# Patient Record
Sex: Female | Born: 1970 | Race: White | Hispanic: No | Marital: Single | Smoking: Never smoker
Health system: Southern US, Community
[De-identification: ages and names within clinical notes are randomized; demographics above are authoritative.]

## PROBLEM LIST (undated history)

## (undated) DIAGNOSIS — M722 Plantar fascial fibromatosis: Secondary | ICD-10-CM

## (undated) DIAGNOSIS — E119 Type 2 diabetes mellitus without complications: Secondary | ICD-10-CM

## (undated) HISTORY — DX: Type 2 diabetes mellitus without complications: E11.9

## (undated) HISTORY — DX: Plantar fascial fibromatosis: M72.2

---

## 1989-02-20 HISTORY — PX: TONSILLECTOMY AND ADENOIDECTOMY: SUR1326

## 1999-06-09 ENCOUNTER — Other Ambulatory Visit: Admission: RE | Admit: 1999-06-09 | Discharge: 1999-06-09 | Payer: Self-pay | Admitting: Obstetrics and Gynecology

## 2000-06-19 ENCOUNTER — Other Ambulatory Visit: Admission: RE | Admit: 2000-06-19 | Discharge: 2000-06-19 | Payer: Self-pay | Admitting: Obstetrics and Gynecology

## 2001-06-19 ENCOUNTER — Other Ambulatory Visit: Admission: RE | Admit: 2001-06-19 | Discharge: 2001-06-19 | Payer: Self-pay | Admitting: Obstetrics and Gynecology

## 2002-06-25 ENCOUNTER — Other Ambulatory Visit: Admission: RE | Admit: 2002-06-25 | Discharge: 2002-06-25 | Payer: Self-pay | Admitting: Obstetrics and Gynecology

## 2003-06-29 ENCOUNTER — Other Ambulatory Visit: Admission: RE | Admit: 2003-06-29 | Discharge: 2003-06-29 | Payer: Self-pay | Admitting: Obstetrics and Gynecology

## 2003-07-09 ENCOUNTER — Encounter: Admission: RE | Admit: 2003-07-09 | Discharge: 2003-07-09 | Payer: Self-pay | Admitting: Obstetrics and Gynecology

## 2004-09-13 ENCOUNTER — Other Ambulatory Visit: Admission: RE | Admit: 2004-09-13 | Discharge: 2004-09-13 | Payer: Self-pay | Admitting: Obstetrics and Gynecology

## 2005-10-25 ENCOUNTER — Other Ambulatory Visit: Admission: RE | Admit: 2005-10-25 | Discharge: 2005-10-25 | Payer: Self-pay | Admitting: Obstetrics and Gynecology

## 2007-01-30 ENCOUNTER — Other Ambulatory Visit: Admission: RE | Admit: 2007-01-30 | Discharge: 2007-01-30 | Payer: Self-pay | Admitting: Obstetrics and Gynecology

## 2008-01-31 ENCOUNTER — Other Ambulatory Visit: Admission: RE | Admit: 2008-01-31 | Discharge: 2008-01-31 | Payer: Self-pay | Admitting: Obstetrics and Gynecology

## 2009-03-03 ENCOUNTER — Encounter: Admission: RE | Admit: 2009-03-03 | Discharge: 2009-03-03 | Payer: Self-pay | Admitting: Obstetrics and Gynecology

## 2010-02-20 DIAGNOSIS — E119 Type 2 diabetes mellitus without complications: Secondary | ICD-10-CM

## 2010-02-20 HISTORY — DX: Type 2 diabetes mellitus without complications: E11.9

## 2010-03-09 ENCOUNTER — Encounter
Admission: RE | Admit: 2010-03-09 | Discharge: 2010-03-09 | Payer: Self-pay | Source: Home / Self Care | Attending: Obstetrics and Gynecology | Admitting: Obstetrics and Gynecology

## 2011-02-10 ENCOUNTER — Other Ambulatory Visit: Payer: Self-pay | Admitting: Obstetrics and Gynecology

## 2011-02-10 DIAGNOSIS — Z1231 Encounter for screening mammogram for malignant neoplasm of breast: Secondary | ICD-10-CM

## 2011-03-15 ENCOUNTER — Ambulatory Visit
Admission: RE | Admit: 2011-03-15 | Discharge: 2011-03-15 | Disposition: A | Discharge status: Home / Self Care | Payer: 59 | Source: Ambulatory Visit | Attending: Obstetrics and Gynecology | Admitting: Obstetrics and Gynecology

## 2011-03-15 DIAGNOSIS — Z1231 Encounter for screening mammogram for malignant neoplasm of breast: Secondary | ICD-10-CM

## 2012-02-06 ENCOUNTER — Other Ambulatory Visit: Payer: Self-pay | Admitting: Obstetrics and Gynecology

## 2012-02-06 DIAGNOSIS — Z1231 Encounter for screening mammogram for malignant neoplasm of breast: Secondary | ICD-10-CM

## 2012-03-15 ENCOUNTER — Ambulatory Visit
Admission: RE | Admit: 2012-03-15 | Discharge: 2012-03-15 | Disposition: A | Discharge status: Home / Self Care | Payer: 59 | Source: Ambulatory Visit | Attending: Obstetrics and Gynecology | Admitting: Obstetrics and Gynecology

## 2012-03-15 DIAGNOSIS — Z1231 Encounter for screening mammogram for malignant neoplasm of breast: Secondary | ICD-10-CM

## 2013-02-18 ENCOUNTER — Other Ambulatory Visit: Payer: Self-pay

## 2013-02-18 DIAGNOSIS — Z1231 Encounter for screening mammogram for malignant neoplasm of breast: Secondary | ICD-10-CM

## 2013-03-19 ENCOUNTER — Ambulatory Visit
Admission: RE | Admit: 2013-03-19 | Discharge: 2013-03-19 | Disposition: A | Discharge status: Home / Self Care | Payer: Self-pay | Source: Ambulatory Visit

## 2013-03-19 DIAGNOSIS — Z1231 Encounter for screening mammogram for malignant neoplasm of breast: Secondary | ICD-10-CM

## 2013-03-20 ENCOUNTER — Other Ambulatory Visit: Payer: Self-pay | Admitting: Obstetrics and Gynecology

## 2013-03-20 ENCOUNTER — Other Ambulatory Visit: Payer: Self-pay | Admitting: Nurse Practitioner

## 2013-03-20 DIAGNOSIS — R928 Other abnormal and inconclusive findings on diagnostic imaging of breast: Secondary | ICD-10-CM

## 2013-03-28 ENCOUNTER — Encounter: Payer: Self-pay | Admitting: *Deleted

## 2013-03-28 DIAGNOSIS — E119 Type 2 diabetes mellitus without complications: Secondary | ICD-10-CM | POA: Insufficient documentation

## 2013-03-31 ENCOUNTER — Encounter: Payer: Self-pay | Admitting: Gynecology

## 2013-03-31 ENCOUNTER — Ambulatory Visit: Payer: Self-pay | Admitting: Obstetrics and Gynecology

## 2013-03-31 ENCOUNTER — Ambulatory Visit
Admission: RE | Admit: 2013-03-31 | Discharge: 2013-03-31 | Disposition: A | Discharge status: Home / Self Care | Payer: 59 | Source: Ambulatory Visit | Attending: Obstetrics and Gynecology | Admitting: Obstetrics and Gynecology

## 2013-03-31 ENCOUNTER — Ambulatory Visit (INDEPENDENT_AMBULATORY_CARE_PROVIDER_SITE_OTHER): Payer: 59 | Admitting: Gynecology

## 2013-03-31 VITALS — BP 117/70 | HR 70 | Resp 18 | Ht 64.75 in | Wt 141.0 lb

## 2013-03-31 DIAGNOSIS — R928 Other abnormal and inconclusive findings on diagnostic imaging of breast: Secondary | ICD-10-CM

## 2013-03-31 DIAGNOSIS — Z01419 Encounter for gynecological examination (general) (routine) without abnormal findings: Secondary | ICD-10-CM

## 2013-03-31 DIAGNOSIS — I10 Essential (primary) hypertension: Secondary | ICD-10-CM

## 2013-03-31 NOTE — Progress Notes (Signed)
43 y.o. Married Caucasian female   G0P0000 here for annual exam. Pt is not currently sexually active.  Diabetes well controlled.  On linsinopril for kidney protection.  Patient's last menstrual period was 03/15/2013.          Sexually active: no  The current method of family planning is none.    Exercising: yes  walk 6x/wk; strength training 1x/wk Last pap: 03/27/12 NEG HR HPV Alcohol: no Tobacco: no BSE: yes  Mammogam 03/19/12 Bi-Rads 0; had [place] right breast under nipple fibroglandular tissue-BiRads 1  Labs: Severiano Gilbert, MD    Health Maintenance  Topic Date Due  . Pap Smear  07/14/1986  . Influenza Vaccine  09/20/2012  . Tetanus/tdap  02/21/2020    Family History  Problem Relation Age of Onset  . Diabetes Mother   . Cervical cancer Mother   . Diabetes Father   . Stroke Father   . Hypertension Father   . Diabetes Maternal Grandmother   . Diabetes Paternal Grandmother   . COPD Paternal Grandmother     Patient Active Problem List   Diagnosis Date Noted  . Essential hypertension, benign 03/31/2013  . Diabetes     Past Medical History  Diagnosis Date  . Plantar fasciitis     Right Heel  . Diabetes     Past Surgical History  Procedure Laterality Date  . Tonsillectomy and adenoidectomy  1991    Allergies: Sudafed  Current Outpatient Prescriptions  Medication Sig Dispense Refill  . Cetirizine HCl (ZYRTEC PO) Take by mouth.      . Ibuprofen (MOTRIN PO) Take by mouth as needed.       Marland Kitchen KRILL OIL PO Take by mouth.      Marland Kitchen lisinopril (PRINIVIL,ZESTRIL) 2.5 MG tablet Take 2.5 mg by mouth daily.      . metFORMIN (GLUCOPHAGE) 500 MG tablet Take by mouth daily.       . Multiple Vitamins-Minerals (MULTIVITAMIN PO) Take by mouth.      . ONE TOUCH ULTRA TEST test strip       . ONETOUCH DELICA LANCETS 33G MISC        No current facility-administered medications for this visit.    ROS: Pertinent items are noted in HPI.  Exam:    BP 117/70  Pulse 70  Resp 18  Ht  5' 4.75" (1.645 m)  Wt 141 lb (63.957 kg)  BMI 23.64 kg/m2  LMP 03/15/2013 Weight change: @WEIGHTCHANGE @ Last 3 height recordings:  Ht Readings from Last 3 Encounters:  03/31/13 5' 4.75" (1.645 m)   General appearance: alert, cooperative and appears stated age Head: Normocephalic, without obvious abnormality, atraumatic Neck: no adenopathy, no carotid bruit, no JVD, supple, symmetrical, trachea midline and thyroid not enlarged, symmetric, no tenderness/mass/nodules Lungs: clear to auscultation bilaterally Breasts: normal appearance, no masses or tenderness, fibroglandular Heart: regular rate and rhythm, S1, S2 normal, no murmur, click, rub or gallop Abdomen: soft, non-tender; bowel sounds normal; no masses,  no organomegaly Extremities: extremities normal, atraumatic, no cyanosis or edema Skin: Skin color, texture, turgor normal. No rashes or lesions Lymph nodes: Cervical, supraclavicular, and axillary nodes normal. no inguinal nodes palpated Neurologic: Grossly normal   Pelvic: External genitalia:  no lesions              Urethra: normal appearing urethra with no masses, tenderness or lesions              Bartholins and Skenes: normal  Vagina: normal appearing vagina with normal color and discharge, no lesions              Cervix: normal appearance              Pap taken: no        Bimanual Exam:  Uterus:  uterus is normal size, shape, consistency and nontender                                      Adnexa:    no masses                                      Rectovaginal: Confirms                                      Anus:  normal sphincter tone, no lesions  A: well woman diabetic     P: mammogram BiRADS 1 pap smear not done counseled on breast self exam, mammography screening, adequate intake of calcium and vitamin D, diet and exercise return annually or prn   An After Visit Summary was printed and given to the patient.

## 2013-03-31 NOTE — Patient Instructions (Signed)

## 2013-08-14 ENCOUNTER — Encounter: Payer: Self-pay | Admitting: Gynecology

## 2014-03-25 ENCOUNTER — Other Ambulatory Visit: Payer: Self-pay

## 2014-03-25 DIAGNOSIS — Z1231 Encounter for screening mammogram for malignant neoplasm of breast: Secondary | ICD-10-CM

## 2014-03-31 ENCOUNTER — Ambulatory Visit
Admission: RE | Admit: 2014-03-31 | Discharge: 2014-03-31 | Disposition: A | Discharge status: Home / Self Care | Payer: 59 | Source: Ambulatory Visit

## 2014-03-31 DIAGNOSIS — Z1231 Encounter for screening mammogram for malignant neoplasm of breast: Secondary | ICD-10-CM

## 2014-04-01 ENCOUNTER — Ambulatory Visit: Payer: 59 | Admitting: Gynecology

## 2014-04-01 ENCOUNTER — Ambulatory Visit (INDEPENDENT_AMBULATORY_CARE_PROVIDER_SITE_OTHER): Payer: 59 | Admitting: Nurse Practitioner

## 2014-04-01 ENCOUNTER — Encounter: Payer: Self-pay | Admitting: Nurse Practitioner

## 2014-04-01 VITALS — BP 94/62 | HR 72 | Resp 16 | Ht 64.6 in | Wt 143.0 lb

## 2014-04-01 DIAGNOSIS — Z01419 Encounter for gynecological examination (general) (routine) without abnormal findings: Secondary | ICD-10-CM

## 2014-04-01 NOTE — Patient Instructions (Signed)

## 2014-04-01 NOTE — Progress Notes (Signed)
44 y.o. G0 Single  Caucasian Fe here for annual exam.  Off OCP since late Jul 12, 2003.  Menses last 3-5 days.  Partner died 07-11-2012. Not dating or SA since then.  DM is under control.  HGB AIC 5.6 in November.  Patient's last menstrual period was 03/30/2014.          Sexually active: No.  The current method of family planning is abstinence.    Exercising: Yes.    Walking, Strenght training  Smoker:  no  Health Maintenance: Pap: 03/2012 Neg. HR HPV:neg MMG: 03/31/14 BIRADS1:neg TDaP:  Jul 12, 2010 Labs: PCP   reports that she has never smoked. She has never used smokeless tobacco. She reports that she does not drink alcohol or use illicit drugs.  Past Medical History  Diagnosis Date  . Plantar fasciitis     Right Heel  . Diabetes 02/2010    treated with oral med's    Past Surgical History  Procedure Laterality Date  . Tonsillectomy and adenoidectomy  Jul 11, 1989    Current Outpatient Prescriptions  Medication Sig Dispense Refill  . Cetirizine HCl (ZYRTEC PO) Take by mouth.    . Ibuprofen (MOTRIN PO) Take by mouth as needed.     Marland Kitchen KRILL OIL PO Take by mouth.    Marland Kitchen lisinopril (PRINIVIL,ZESTRIL) 2.5 MG tablet Take 2.5 mg by mouth daily.    . metFORMIN (GLUCOPHAGE-XR) 500 MG 24 hr tablet     . Multiple Vitamins-Minerals (MULTIVITAMIN PO) Take by mouth.    . ONE TOUCH ULTRA TEST test strip     . ONETOUCH DELICA LANCETS 33G MISC      No current facility-administered medications for this visit.    Family History  Problem Relation Age of Onset  . Diabetes Mother   . Cervical cancer Mother   . Diabetes Father   . Stroke Father   . Hypertension Father   . Diabetes Maternal Grandmother   . Diabetes Paternal Grandmother   . COPD Paternal Grandmother   . Kidney cancer Mother     hx. cervical cancer    ROS:  Pertinent items are noted in HPI.  Otherwise, a comprehensive ROS was negative.  Exam:   BP 94/62 mmHg  Pulse 72  Resp 16  Ht 5' 4.6" (1.641 m)  Wt 143 lb (64.864 kg)  BMI 24.09 kg/m2  LMP  03/30/2014 Height: 5' 4.6" (164.1 cm) Ht Readings from Last 3 Encounters:  04/01/14 5' 4.6" (1.641 m)  03/31/13 5' 4.75" (1.645 m)    General appearance: alert, cooperative and appears stated age Head: Normocephalic, without obvious abnormality, atraumatic Neck: no adenopathy, supple, symmetrical, trachea midline and thyroid normal to inspection and palpation Lungs: clear to auscultation bilaterally Breasts: normal appearance, no masses or tenderness Heart: regular rate and rhythm Abdomen: soft, non-tender; no masses,  no organomegaly Extremities: extremities normal, atraumatic, no cyanosis or edema Skin: Skin color, texture, turgor normal. No rashes or lesions Lymph nodes: Cervical, supraclavicular, and axillary nodes normal. No abnormal inguinal nodes palpated Neurologic: Grossly normal    Pelvic: External genitalia:  no lesions              Urethra:  normal appearing urethra with no masses, tenderness or lesions              Bartholin's and Skene's: normal                 Vagina: normal appearing vagina with normal color and moderate vaginal bleeding, no lesions  Cervix: anteverted              Pap taken: No. Bimanual Exam:  Uterus:  normal size, contour, position, consistency, mobility, non-tender              Adnexa: no mass, fullness, tenderness               Rectovaginal: Confirms               Anus:  normal sphincter tone, no lesions  Chaperone present: No  A:  Well Woman with normal exam  Normal menses  History of DM II since 02/2010 on oral med's and well controlled   P:   Reviewed health and wellness pertinent to exam  Pap smear not taken today  Mammogram is due 03/2015  Counseled on breast self exam, mammography screening, adequate intake of calcium and vitamin D, diet and exercise return annually or prn  An After Visit Summary was printed and given to the patient.

## 2014-04-03 NOTE — Progress Notes (Signed)
Reviewed personally.  M. Suzanne Taleyah Hillman, MD.  

## 2015-03-09 ENCOUNTER — Other Ambulatory Visit: Payer: Self-pay

## 2015-03-09 DIAGNOSIS — Z1231 Encounter for screening mammogram for malignant neoplasm of breast: Secondary | ICD-10-CM

## 2015-04-07 ENCOUNTER — Ambulatory Visit
Admission: RE | Admit: 2015-04-07 | Discharge: 2015-04-07 | Disposition: A | Discharge status: Home / Self Care | Payer: Commercial Managed Care - HMO | Source: Ambulatory Visit

## 2015-04-07 DIAGNOSIS — Z1231 Encounter for screening mammogram for malignant neoplasm of breast: Secondary | ICD-10-CM

## 2015-04-28 ENCOUNTER — Encounter: Payer: Self-pay | Admitting: Nurse Practitioner

## 2015-04-28 ENCOUNTER — Ambulatory Visit (INDEPENDENT_AMBULATORY_CARE_PROVIDER_SITE_OTHER): Payer: Commercial Managed Care - HMO | Admitting: Nurse Practitioner

## 2015-04-28 VITALS — BP 108/62 | HR 80 | Resp 14 | Ht 64.5 in | Wt 146.0 lb

## 2015-04-28 DIAGNOSIS — Z0000 Encounter for general adult medical examination without abnormal findings: Secondary | ICD-10-CM | POA: Diagnosis not present

## 2015-04-28 DIAGNOSIS — Z01419 Encounter for gynecological examination (general) (routine) without abnormal findings: Secondary | ICD-10-CM | POA: Diagnosis not present

## 2015-04-28 NOTE — Patient Instructions (Addendum)

## 2015-04-28 NOTE — Progress Notes (Signed)
Patient ID: Marissa Foster, female   DOB: 1970-08-15, 45 y.o.   MRN: [id number] 45 y.o. G0P0 Single  Caucasian Fe here for annual exam.  Menses now at 5 days.  Irregular in January and February was normal. Still some insomnia and bloating.  Not dating or SA since 2014.  Last HGB AIC 6.0 last week.  Patient's last menstrual period was 04/20/2015.          Sexually active: No.  The current method of family planning is none.    Exercising: Yes.    walking Smoker:  no  Health Maintenance: Pap:  03/2012 WNL NEG HR HPV MMG:  04-14-15 WNL BIRADS 1 TDaP:  02-20-2010 Pneumonia: 2012 PCP HIV: 01/31/2008 Labs: PCP does labs    reports that she has never smoked. She has never used smokeless tobacco. She reports that she does not drink alcohol or use illicit drugs.  Past Medical History  Diagnosis Date  . Plantar fasciitis     Right Heel  . Diabetes (HCC) 02/2010    treated with oral med's    Past Surgical History  Procedure Laterality Date  . Tonsillectomy and adenoidectomy  1991    Current Outpatient Prescriptions  Medication Sig Dispense Refill  . Cetirizine HCl (ZYRTEC PO) Take by mouth.    . Ibuprofen (MOTRIN PO) Take by mouth as needed.     Marland Kitchen lisinopril (PRINIVIL,ZESTRIL) 2.5 MG tablet Take 2.5 mg by mouth daily.    . metFORMIN (GLUCOPHAGE-XR) 500 MG 24 hr tablet     . Multiple Vitamins-Minerals (MULTIVITAMIN PO) Take by mouth.    . ONE TOUCH ULTRA TEST test strip     . ONETOUCH DELICA LANCETS 33G MISC     . KRILL OIL PO Take by mouth. Reported on 04/28/2015     No current facility-administered medications for this visit.    Family History  Problem Relation Age of Onset  . Diabetes Mother   . Cervical cancer Mother   . Kidney cancer Mother     hx. cervical cancer  . Diabetes Father     right leg below knee amputation  . Stroke Father   . Hypertension Father   . Diabetes Maternal Grandmother   . Diabetes Paternal Grandmother   . COPD Paternal Grandmother   . Hyperlipidemia  Brother   . Hypertension Brother     ROS:  Pertinent items are noted in HPI.  Otherwise, a comprehensive ROS was negative.  Exam:   BP 108/62 mmHg  Pulse 80  Resp 14  Ht 5' 4.5" (1.638 m)  Wt 146 lb (66.225 kg)  BMI 24.68 kg/m2  LMP 04/20/2015 Height: 5' 4.5" (163.8 cm) Ht Readings from Last 3 Encounters:  04/28/15 5' 4.5" (1.638 m)  04/01/14 5' 4.6" (1.641 m)  03/31/13 5' 4.75" (1.645 m)    General appearance: alert, cooperative and appears stated age Head: Normocephalic, without obvious abnormality, atraumatic Neck: no adenopathy, supple, symmetrical, trachea midline and thyroid normal to inspection and palpation Lungs: clear to auscultation bilaterally Breasts: normal appearance, no masses or tenderness Heart: regular rate and rhythm Abdomen: soft, non-tender; no masses,  no organomegaly Extremities: extremities normal, atraumatic, no cyanosis or edema Skin: Skin color, texture, turgor normal. No rashes or lesions Lymph nodes: Cervical, supraclavicular, and axillary nodes normal. No abnormal inguinal nodes palpated Neurologic: Grossly normal   Pelvic: External genitalia:  no lesions              Urethra:  normal appearing urethra with no  masses, tenderness or lesions              Bartholin's and Skene's: normal                 Vagina: normal appearing vagina with normal color and discharge, no lesions              Cervix: anteverted              Pap taken: Yes.   Bimanual Exam:  Uterus:  normal size, contour, position, consistency, mobility, non-tender              Adnexa: no mass, fullness, tenderness               Rectovaginal: Confirms               Anus:  normal sphincter tone, no lesions  Chaperone present: yes  A:  Well Woman with normal exam  Normal menses History of DM II since 02/2010 on oral med's and well controlled  Not SA   P:   Reviewed health and wellness pertinent to exam  Pap smear as above  Mammogram is due 03/2016  Counseled on  breast self exam, mammography screening, adequate intake of calcium and vitamin D, diet and exercise return annually or prn  An After Visit Summary was printed and given to the patient.

## 2015-04-29 ENCOUNTER — Encounter: Payer: Self-pay | Admitting: Nurse Practitioner

## 2015-05-02 LAB — IPS PAP TEST WITH HPV

## 2015-05-02 NOTE — Progress Notes (Signed)
Encounter reviewed by Dr. Brook Amundson C. Silva.  

## 2015-10-26 DIAGNOSIS — Z0000 Encounter for general adult medical examination without abnormal findings: Secondary | ICD-10-CM | POA: Diagnosis not present

## 2015-10-26 DIAGNOSIS — Z13220 Encounter for screening for lipoid disorders: Secondary | ICD-10-CM | POA: Diagnosis not present

## 2015-10-26 DIAGNOSIS — Z23 Encounter for immunization: Secondary | ICD-10-CM | POA: Diagnosis not present

## 2015-10-26 DIAGNOSIS — E119 Type 2 diabetes mellitus without complications: Secondary | ICD-10-CM | POA: Diagnosis not present

## 2015-11-23 DIAGNOSIS — H524 Presbyopia: Secondary | ICD-10-CM | POA: Diagnosis not present

## 2015-11-23 DIAGNOSIS — E119 Type 2 diabetes mellitus without complications: Secondary | ICD-10-CM | POA: Diagnosis not present

## 2016-03-01 ENCOUNTER — Other Ambulatory Visit: Payer: Self-pay | Admitting: Family Medicine

## 2016-03-01 DIAGNOSIS — Z1231 Encounter for screening mammogram for malignant neoplasm of breast: Secondary | ICD-10-CM

## 2016-04-07 ENCOUNTER — Ambulatory Visit
Admission: RE | Admit: 2016-04-07 | Discharge: 2016-04-07 | Disposition: A | Discharge status: Home / Self Care | Payer: BLUE CROSS/BLUE SHIELD | Source: Ambulatory Visit | Attending: Family Medicine | Admitting: Family Medicine

## 2016-04-07 DIAGNOSIS — Z1231 Encounter for screening mammogram for malignant neoplasm of breast: Secondary | ICD-10-CM | POA: Diagnosis not present

## 2016-04-24 DIAGNOSIS — E119 Type 2 diabetes mellitus without complications: Secondary | ICD-10-CM | POA: Diagnosis not present

## 2016-05-09 NOTE — Progress Notes (Signed)
46 y.o. G0P0 Single  Caucasian Fe here for annual exam.  Menses now at 3-5 days.  No vaso symptoms.  Some cramps and mild HA.  No new health problems.   Patient's last menstrual period was 04/30/2016.          Sexually active: No.  The current method of family planning is abstinence.    Exercising: Yes.    walking, strength training Smoker:  no  Health Maintenance: Pap:  04/28/15 Pap smear and HR HPV negative           03/2012 WNL NEG HR HPV MMG:  04/07/16 BIRADS 1 negative/density c TDaP:  02/20/2010 Pneumonia: 2012 PCP HIV: 01/31/2008 Labs: PCP does labs   reports that she has never smoked. She has never used smokeless tobacco. She reports that she does not drink alcohol or use drugs.  Past Medical History:  Diagnosis Date  . Diabetes (HCC) 02/2010   treated with oral med's  . Plantar fasciitis    Right Heel    Past Surgical History:  Procedure Laterality Date  . TONSILLECTOMY AND ADENOIDECTOMY  1991    Current Outpatient Prescriptions  Medication Sig Dispense Refill  . Cetirizine HCl (ZYRTEC PO) Take by mouth.    Marland Kitchen glucose blood (BAYER CONTOUR TEST) test strip 1 each by Other route daily. Use as instructed    . Ibuprofen (MOTRIN PO) Take by mouth as needed.     . Lancets MISC by Does not apply route. Contour lancets; check blood sugar every morning    . lisinopril (PRINIVIL,ZESTRIL) 2.5 MG tablet Take 2.5 mg by mouth daily.    . metFORMIN (GLUCOPHAGE-XR) 500 MG 24 hr tablet     . Multiple Vitamins-Minerals (MULTIVITAMIN PO) Take by mouth.     No current facility-administered medications for this visit.     Family History  Problem Relation Age of Onset  . Diabetes Mother   . Cervical cancer Mother   . Kidney cancer Mother     hx. cervical cancer  . Diabetes Father     right leg below knee amputation  . Stroke Father   . Hypertension Father   . Hyperlipidemia Brother   . Hypertension Brother   . Diabetes Maternal Grandmother   . Diabetes Paternal Grandmother   .  COPD Paternal Grandmother     ROS:  Pertinent items are noted in HPI.  Otherwise, a comprehensive ROS was negative.  Exam:   BP 110/62 (BP Location: Right Arm, Patient Position: Sitting, Cuff Size: Normal)   Pulse 76   Resp 16   Ht 5' 4.5" (1.638 m)   Wt 147 lb (66.7 kg)   LMP 04/30/2016   BMI 24.84 kg/m  Height: 5' 4.5" (163.8 cm) Ht Readings from Last 3 Encounters:  05/10/16 5' 4.5" (1.638 m)  04/28/15 5' 4.5" (1.638 m)  04/01/14 5' 4.6" (1.641 m)    General appearance: alert, cooperative and appears stated age Head: Normocephalic, without obvious abnormality, atraumatic Neck: no adenopathy, supple, symmetrical, trachea midline and thyroid normal to inspection and palpation Lungs: clear to auscultation bilaterally Breasts: normal appearance, no masses or tenderness Heart: regular rate and rhythm Abdomen: soft, non-tender; no masses,  no organomegaly Extremities: extremities normal, atraumatic, no cyanosis or edema Skin: Skin color, texture, turgor normal. No rashes or lesions Lymph nodes: Cervical, supraclavicular, and axillary nodes normal. No abnormal inguinal nodes palpated Neurologic: Grossly normal   Pelvic: External genitalia:  no lesions  Urethra:  normal appearing urethra with no masses, tenderness or lesions              Bartholin's and Skene's: normal                 Vagina: normal appearing vagina with normal color and discharge, no lesions              Cervix: anteverted              Pap taken: No. Bimanual Exam:  Uterus:  normal size, contour, position, consistency, mobility, non-tender              Adnexa: no mass, fullness, tenderness               Rectovaginal: Confirms               Anus:  normal sphincter tone, no lesions  Chaperone present: no  A:  Well Woman with normal exam  Normal menses History of DM II since 02/2010 on oral med's and well controlled             Not SA   P:   Reviewed health and wellness pertinent to  exam  Pap smear not done  Mammogram is due 03/2017 counseled on breast self exam, mammography screening, adequate intake of calcium and vitamin D, diet and exercise return annually or prn  An After Visit Summary was printed and given to the patient.

## 2016-05-10 ENCOUNTER — Ambulatory Visit (INDEPENDENT_AMBULATORY_CARE_PROVIDER_SITE_OTHER): Payer: BLUE CROSS/BLUE SHIELD | Admitting: Nurse Practitioner

## 2016-05-10 ENCOUNTER — Encounter: Payer: Self-pay | Admitting: Nurse Practitioner

## 2016-05-10 VITALS — BP 110/62 | HR 76 | Resp 16 | Ht 64.5 in | Wt 147.0 lb

## 2016-05-10 DIAGNOSIS — Z0000 Encounter for general adult medical examination without abnormal findings: Secondary | ICD-10-CM | POA: Diagnosis not present

## 2016-05-10 DIAGNOSIS — Z01411 Encounter for gynecological examination (general) (routine) with abnormal findings: Secondary | ICD-10-CM | POA: Diagnosis not present

## 2016-05-10 DIAGNOSIS — E559 Vitamin D deficiency, unspecified: Secondary | ICD-10-CM

## 2016-05-10 LAB — VITAMIN D 25 HYDROXY (VIT D DEFICIENCY, FRACTURES): VIT D 25 HYDROXY: 37 ng/mL (ref 30–100)

## 2016-05-10 NOTE — Patient Instructions (Signed)

## 2016-05-11 NOTE — Progress Notes (Signed)
Encounter reviewed by Dr. Brook Amundson C. Silva.  

## 2016-06-13 DIAGNOSIS — M25552 Pain in left hip: Secondary | ICD-10-CM | POA: Diagnosis not present

## 2016-06-14 ENCOUNTER — Ambulatory Visit
Admission: RE | Admit: 2016-06-14 | Discharge: 2016-06-14 | Disposition: A | Discharge status: Home / Self Care | Payer: BLUE CROSS/BLUE SHIELD | Source: Ambulatory Visit | Attending: Family Medicine | Admitting: Family Medicine

## 2016-06-14 ENCOUNTER — Other Ambulatory Visit: Payer: Self-pay | Admitting: Family Medicine

## 2016-06-14 DIAGNOSIS — M25552 Pain in left hip: Secondary | ICD-10-CM | POA: Diagnosis not present

## 2016-09-26 ENCOUNTER — Telehealth: Payer: Self-pay | Admitting: Obstetrics & Gynecology

## 2016-09-26 NOTE — Telephone Encounter (Signed)
Left patient a message to call back to reschedule a future appointment that was cancelled by the provider. °

## 2016-10-11 DIAGNOSIS — L0390 Cellulitis, unspecified: Secondary | ICD-10-CM | POA: Diagnosis not present

## 2016-10-11 DIAGNOSIS — L600 Ingrowing nail: Secondary | ICD-10-CM | POA: Diagnosis not present

## 2016-10-17 ENCOUNTER — Encounter: Payer: Self-pay | Admitting: Podiatry

## 2016-10-17 ENCOUNTER — Ambulatory Visit (INDEPENDENT_AMBULATORY_CARE_PROVIDER_SITE_OTHER): Payer: BLUE CROSS/BLUE SHIELD | Admitting: Podiatry

## 2016-10-17 ENCOUNTER — Telehealth: Payer: Self-pay | Admitting: *Deleted

## 2016-10-17 VITALS — BP 122/77 | HR 78

## 2016-10-17 DIAGNOSIS — L600 Ingrowing nail: Secondary | ICD-10-CM

## 2016-10-17 DIAGNOSIS — E119 Type 2 diabetes mellitus without complications: Secondary | ICD-10-CM

## 2016-10-17 DIAGNOSIS — L03031 Cellulitis of right toe: Secondary | ICD-10-CM

## 2016-10-17 MED ORDER — CEPHALEXIN 500 MG PO CAPS: 500.0000 mg | ORAL_CAPSULE | Freq: Two times a day (BID) | ORAL | 0 refills | Status: DC

## 2016-10-17 NOTE — Telephone Encounter (Signed)
Received escribed message that rx was not received properly at the [facility]. I called the Cephalexin 500mg  #20 one bid to the [id number].

## 2016-10-17 NOTE — Progress Notes (Signed)
   Subjective:    Patient ID: Marissa Foster, female    DOB: April 26, 1970, 46 y.o.   MRN: [id number]  HPI this patient presents the office with chief complaint of a painful ingrowing toenail on the big toe, right foot.  Patient states that she worked on her nails herself in early July and since that time. She has developed a red, swollen, infected ingrown toenail right big toe.  She says she has tried to heal herself and she has continued to be active by walking, but the problem has never resolved.  She says this area has become increasingly red, swollen and painful and presents the office today for definitive evaluation and treatment of this infected ingrown toenail.  This patient is diabetic on metformin.    Review of Systems  All other systems reviewed and are negative.      Objective:   Physical Exam GENERAL APPEARANCE: Alert, conversant. Appropriately groomed. No acute distress.  VASCULAR: Pedal pulses are  palpable at  [facility] and PT bilateral.  Capillary refill time is immediate to all digits,  Normal temperature gradient.  Digital hair growth is present bilateral  NEUROLOGIC: sensation is normal to 5.07 monofilament at 5/5 sites bilateral.  Light touch is intact bilateral, Muscle strength normal.  MUSCULOSKELETAL: acceptable muscle strength, tone and stability bilateral.  Intrinsic muscluature intact bilateral.  Rectus appearance of foot and digits noted bilateral.  NAILS  red, swollen, inflamed lateral border right great toenail.   DERMATOLOGIC: skin color, texture, and turgor are within normal limits.  No preulcerative lesions or ulcers  are seen, no interdigital maceration noted.  No open lesions present.  Digital nails are asymptomatic. No drainage noted.         Assessment & Plan:  Paronychia right great toenail   Ingrown toenail right great toe.  IE  Nail surgery.  Treatment options and alternatives discussed.  Recommended permanent phenol matrixectomy and patient agreed.  Right  hallux  was prepped with alcohol and a toe block of 3cc of 2% lidocaine plain was administered in a digital toe block. .  The toe was then prepped with betadine solution .  The offending nail border was then excised and matrix tissue exposed.  Phenol was then applied to the matrix tissue followed by an alcohol wash.  Antibiotic ointment and a dry sterile dressing was applied.  The patient was dispensed instructions for aftercare. Cephalexin 500 mg.  #20.  RTC 1 week.     Helane Gunther DPM

## 2016-10-25 ENCOUNTER — Ambulatory Visit: Payer: BLUE CROSS/BLUE SHIELD | Admitting: Podiatry

## 2016-11-02 DIAGNOSIS — E119 Type 2 diabetes mellitus without complications: Secondary | ICD-10-CM | POA: Diagnosis not present

## 2016-11-02 DIAGNOSIS — Z23 Encounter for immunization: Secondary | ICD-10-CM | POA: Diagnosis not present

## 2016-11-02 DIAGNOSIS — Z0000 Encounter for general adult medical examination without abnormal findings: Secondary | ICD-10-CM | POA: Diagnosis not present

## 2016-11-22 DIAGNOSIS — H5211 Myopia, right eye: Secondary | ICD-10-CM | POA: Diagnosis not present

## 2016-11-22 DIAGNOSIS — E119 Type 2 diabetes mellitus without complications: Secondary | ICD-10-CM | POA: Diagnosis not present

## 2017-03-21 ENCOUNTER — Other Ambulatory Visit: Payer: Self-pay | Admitting: Family Medicine

## 2017-03-21 DIAGNOSIS — Z1231 Encounter for screening mammogram for malignant neoplasm of breast: Secondary | ICD-10-CM

## 2017-04-10 ENCOUNTER — Ambulatory Visit
Admission: RE | Admit: 2017-04-10 | Discharge: 2017-04-10 | Disposition: A | Discharge status: Home / Self Care | Payer: BLUE CROSS/BLUE SHIELD | Source: Ambulatory Visit | Attending: Family Medicine | Admitting: Family Medicine

## 2017-04-10 DIAGNOSIS — Z1231 Encounter for screening mammogram for malignant neoplasm of breast: Secondary | ICD-10-CM

## 2017-05-02 DIAGNOSIS — E119 Type 2 diabetes mellitus without complications: Secondary | ICD-10-CM | POA: Diagnosis not present

## 2017-05-14 ENCOUNTER — Ambulatory Visit: Payer: BLUE CROSS/BLUE SHIELD | Admitting: Nurse Practitioner

## 2017-05-16 ENCOUNTER — Encounter: Payer: Self-pay | Admitting: Certified Nurse Midwife

## 2017-05-16 ENCOUNTER — Ambulatory Visit: Payer: BLUE CROSS/BLUE SHIELD | Admitting: Certified Nurse Midwife

## 2017-05-16 ENCOUNTER — Other Ambulatory Visit: Payer: Self-pay

## 2017-05-16 VITALS — BP 108/70 | HR 70 | Resp 16 | Ht 64.5 in | Wt 146.0 lb

## 2017-05-16 DIAGNOSIS — Z8639 Personal history of other endocrine, nutritional and metabolic disease: Secondary | ICD-10-CM

## 2017-05-16 DIAGNOSIS — N951 Menopausal and female climacteric states: Secondary | ICD-10-CM | POA: Diagnosis not present

## 2017-05-16 DIAGNOSIS — Z01419 Encounter for gynecological examination (general) (routine) without abnormal findings: Secondary | ICD-10-CM | POA: Diagnosis not present

## 2017-05-16 NOTE — Patient Instructions (Signed)

## 2017-05-16 NOTE — Progress Notes (Signed)
47 y.o. G0P0 Single  Caucasian Fe here for annual exam. Periods changing, now less heavy with 3 day duration from her long term of 5 days. Usually no every 25-26 days, keeping menses record. No missed periods. Denies any missed periods. Sees Dr. Merri Brunetteandace Smith yearly for exam and labs, glucose management and Lisinopril(no hypertension). All stable per patient, is seen every 6 months. Emotionally sad, Dad passed away in 9/18. Has family and friends support. No other health issues today.   Patient's last menstrual period was 05/07/2017 (exact date).          Sexually active: No.  The current method of family planning is abstinence.    Exercising: Yes.    walking & strength training Smoker:  no  Health Maintenance: Pap:  04-28-15 neg HPV HR neg History of Abnormal Pap: no MMG:  04-10-17 category b density birads 1:neg Self Breast exams: yes Colonoscopy:  none BMD:   none TDaP:  2012 Shingles: no Pneumonia: 2012 Hep C and HIV: HIV neg per patient Labs: with PCP   reports that she has never smoked. She has never used smokeless tobacco. She reports that she does not drink alcohol or use drugs.  Past Medical History:  Diagnosis Date  . Diabetes ([facility]) 02/2010   treated with oral med's  . Plantar fasciitis    Right Heel    Past Surgical History:  Procedure Laterality Date  . TONSILLECTOMY AND ADENOIDECTOMY  1991    Current Outpatient Medications  Medication Sig Dispense Refill  . Cetirizine HCl (ZYRTEC PO) Take by mouth.    Marland Kitchen. glucose blood (BAYER CONTOUR TEST) test strip 1 each by Other route daily. Use as instructed    . Lancets MISC by Does not apply route. Contour lancets; check blood sugar every morning    . lisinopril (PRINIVIL,ZESTRIL) 2.5 MG tablet Take 2.5 mg by mouth daily.    . metFORMIN (GLUCOPHAGE-XR) 500 MG 24 hr tablet 2 (two) times daily before a meal.     . Multiple Vitamins-Minerals (MULTIVITAMIN PO) Take by mouth.    . Ibuprofen (MOTRIN PO) Take by mouth as needed.       No current facility-administered medications for this visit.     Family History  Problem Relation Age of Onset  . Diabetes Mother   . Cervical cancer Mother   . Kidney cancer Mother        hx. cervical cancer  . Diabetes Father        right leg below knee amputation  . Stroke Father   . Hypertension Father   . Hyperlipidemia Brother   . Hypertension Brother   . Diabetes Maternal Grandmother   . Diabetes Paternal Grandmother   . COPD Paternal Grandmother     ROS:  Pertinent items are noted in HPI.  Otherwise, a comprehensive ROS was negative.  Exam:   BP 108/70   Pulse 70   Resp 16   Ht 5' 4.5" (1.638 m)   Wt 146 lb (66.2 kg)   LMP 05/07/2017 (Exact Date)   BMI 24.67 kg/m  Height: 5' 4.5" (163.8 cm) Ht Readings from Last 3 Encounters:  05/16/17 5' 4.5" (1.638 m)  05/10/16 5' 4.5" (1.638 m)  04/28/15 5' 4.5" (1.638 m)    General appearance: alert, cooperative and appears stated age Head: Normocephalic, without obvious abnormality, atraumatic Neck: no adenopathy, supple, symmetrical, trachea midline and thyroid normal to inspection and palpation Lungs: clear to auscultation bilaterally Breasts: normal appearance, no masses or tenderness, No  nipple retraction or dimpling, No nipple discharge or bleeding, No axillary or supraclavicular adenopathy Heart: regular rate and rhythm Abdomen: soft, non-tender; no masses,  no organomegaly Extremities: extremities normal, atraumatic, no cyanosis or edema Skin: Skin color, texture, turgor normal. No rashes or lesions Lymph nodes: Cervical, supraclavicular, and axillary nodes normal. No abnormal inguinal nodes palpated Neurologic: Grossly normal   Pelvic: External genitalia:  no lesions              Urethra:  normal appearing urethra with no masses, tenderness or lesions              Bartholin's and Skene's: normal                 Vagina: normal appearing vagina with normal color and discharge, no lesions               Cervix: no cervical motion tenderness, no lesions and nulliparous appearance              Pap taken: No. Bimanual Exam:  Uterus:  normal size, contour, position, consistency, mobility, non-tender              Adnexa: normal adnexa and no mass, fullness, tenderness               Rectovaginal: Confirms               Anus:  normal sphincter tone, no lesions  Chaperone present: yes  A:  Well Woman with normal exam  Not sexually active ever  Cycle change with shorter cycles, no missed periods ? perimenopausal  Diabetes on Metformin/Lisinpril with PCP management  Recent death of father  P:   Reviewed health and wellness pertinent to exam  Will advise if becomes sexually active  Discussed cycle changes with perimenopause or with weight change. Discussed importance of keeping record of menses and advise if not normal per printed information given or misses more than 2 periods. Questions addressed.  Continue with PCP as indicated  Discussed seeking comfort and support from friends and family and grief support if needed.  Pap smear: no   counseled on breast self exam, STD prevention, HIV risk factors and prevention, adequate intake of calcium and vitamin D, diet and exercise,colonoscopy screening  return annually or prn  An After Visit Summary was printed and given to the patient.

## 2017-06-21 DIAGNOSIS — F4322 Adjustment disorder with anxiety: Secondary | ICD-10-CM | POA: Diagnosis not present

## 2017-06-26 DIAGNOSIS — F4322 Adjustment disorder with anxiety: Secondary | ICD-10-CM | POA: Diagnosis not present

## 2017-07-11 DIAGNOSIS — L723 Sebaceous cyst: Secondary | ICD-10-CM | POA: Diagnosis not present

## 2017-07-31 DIAGNOSIS — F4322 Adjustment disorder with anxiety: Secondary | ICD-10-CM | POA: Diagnosis not present

## 2017-08-09 DIAGNOSIS — F4322 Adjustment disorder with anxiety: Secondary | ICD-10-CM | POA: Diagnosis not present

## 2017-08-30 DIAGNOSIS — F4322 Adjustment disorder with anxiety: Secondary | ICD-10-CM | POA: Diagnosis not present

## 2017-09-04 DIAGNOSIS — F4322 Adjustment disorder with anxiety: Secondary | ICD-10-CM | POA: Diagnosis not present

## 2017-09-20 DIAGNOSIS — F4322 Adjustment disorder with anxiety: Secondary | ICD-10-CM | POA: Diagnosis not present

## 2017-10-01 DIAGNOSIS — F4322 Adjustment disorder with anxiety: Secondary | ICD-10-CM | POA: Diagnosis not present

## 2017-10-16 DIAGNOSIS — F4322 Adjustment disorder with anxiety: Secondary | ICD-10-CM | POA: Diagnosis not present

## 2017-10-29 DIAGNOSIS — F4322 Adjustment disorder with anxiety: Secondary | ICD-10-CM | POA: Diagnosis not present

## 2017-11-13 DIAGNOSIS — F4322 Adjustment disorder with anxiety: Secondary | ICD-10-CM | POA: Diagnosis not present

## 2017-11-19 DIAGNOSIS — E119 Type 2 diabetes mellitus without complications: Secondary | ICD-10-CM | POA: Diagnosis not present

## 2017-11-19 DIAGNOSIS — Z23 Encounter for immunization: Secondary | ICD-10-CM | POA: Diagnosis not present

## 2017-11-19 DIAGNOSIS — Z0000 Encounter for general adult medical examination without abnormal findings: Secondary | ICD-10-CM | POA: Diagnosis not present

## 2017-11-21 DIAGNOSIS — E119 Type 2 diabetes mellitus without complications: Secondary | ICD-10-CM | POA: Diagnosis not present

## 2017-11-21 DIAGNOSIS — H52203 Unspecified astigmatism, bilateral: Secondary | ICD-10-CM | POA: Diagnosis not present

## 2017-11-21 DIAGNOSIS — H524 Presbyopia: Secondary | ICD-10-CM | POA: Diagnosis not present

## 2017-12-12 DIAGNOSIS — F4322 Adjustment disorder with anxiety: Secondary | ICD-10-CM | POA: Diagnosis not present

## 2017-12-26 DIAGNOSIS — F4322 Adjustment disorder with anxiety: Secondary | ICD-10-CM | POA: Diagnosis not present

## 2018-01-09 DIAGNOSIS — F4322 Adjustment disorder with anxiety: Secondary | ICD-10-CM | POA: Diagnosis not present

## 2018-01-15 DIAGNOSIS — F4322 Adjustment disorder with anxiety: Secondary | ICD-10-CM | POA: Diagnosis not present

## 2018-02-07 DIAGNOSIS — F4322 Adjustment disorder with anxiety: Secondary | ICD-10-CM | POA: Diagnosis not present

## 2018-02-27 DIAGNOSIS — F4322 Adjustment disorder with anxiety: Secondary | ICD-10-CM | POA: Diagnosis not present

## 2018-03-14 DIAGNOSIS — G5601 Carpal tunnel syndrome, right upper limb: Secondary | ICD-10-CM | POA: Diagnosis not present

## 2018-03-27 DIAGNOSIS — F4322 Adjustment disorder with anxiety: Secondary | ICD-10-CM | POA: Diagnosis not present

## 2018-04-03 ENCOUNTER — Other Ambulatory Visit: Payer: Self-pay | Admitting: Family Medicine

## 2018-04-03 DIAGNOSIS — Z1231 Encounter for screening mammogram for malignant neoplasm of breast: Secondary | ICD-10-CM

## 2018-04-16 DIAGNOSIS — F4322 Adjustment disorder with anxiety: Secondary | ICD-10-CM | POA: Diagnosis not present

## 2018-04-19 ENCOUNTER — Ambulatory Visit
Admission: RE | Admit: 2018-04-19 | Discharge: 2018-04-19 | Disposition: A | Discharge status: Home / Self Care | Payer: BLUE CROSS/BLUE SHIELD | Source: Ambulatory Visit | Attending: Family Medicine | Admitting: Family Medicine

## 2018-04-19 DIAGNOSIS — Z1231 Encounter for screening mammogram for malignant neoplasm of breast: Secondary | ICD-10-CM | POA: Diagnosis not present

## 2018-05-22 ENCOUNTER — Ambulatory Visit: Payer: BLUE CROSS/BLUE SHIELD | Admitting: Certified Nurse Midwife

## 2018-05-22 DIAGNOSIS — E119 Type 2 diabetes mellitus without complications: Secondary | ICD-10-CM | POA: Diagnosis not present

## 2018-06-04 DIAGNOSIS — F4322 Adjustment disorder with anxiety: Secondary | ICD-10-CM | POA: Diagnosis not present

## 2018-06-18 DIAGNOSIS — F4322 Adjustment disorder with anxiety: Secondary | ICD-10-CM | POA: Diagnosis not present

## 2018-07-19 ENCOUNTER — Telehealth: Payer: Self-pay | Admitting: Certified Nurse Midwife

## 2018-07-19 NOTE — Telephone Encounter (Signed)
Left message on voicemail to call and reschedule cancelled appointment. °

## 2018-08-14 NOTE — Progress Notes (Signed)
48 y.o. G0P0 Single  Caucasian Fe here for annual exam. Periods normal no issues. 25- 28 day cycle. Duration 3 days and lighter. Has noted this over the past year. No hot flashes or night sweats.  Discussed menstrual changes with perimenopause and need to advise.if no period in 3 months. Has booklet on Menopause. Sees Candace Smith for aex, labs, glucose management and medication. All stable per patient. Has not been sexually active.  No other health issues today.  Patient's last menstrual period was 08/10/2018 (exact date).          Sexually active: No.  The current method of family planning is abstinence.    Exercising: No.  walking Smoker:  no  Review of Systems  Constitutional: Negative.   HENT: Negative.   Eyes: Negative.   Respiratory: Negative.   Cardiovascular: Negative.   Gastrointestinal: Negative.   Genitourinary: Negative.   Musculoskeletal: Negative.   Skin: Negative.   Neurological: Negative.   Endo/Heme/Allergies: Negative.   Psychiatric/Behavioral: Negative.     Health Maintenance: Pap:  04-28-15 neg HPV HR neg History of Abnormal Pap: no MMG:  04-19-2018 category b density birads 1:neg Self Breast exams: yes Colonoscopy:  BMD:   none TDaP:  2012 Shingles: not done Pneumonia: 2013 Hep C and HIV: HIV neg per patient Labs: ifneeded   reports that she has never smoked. She has never used smokeless tobacco. She reports that she does not drink alcohol or use drugs.  Past Medical History:  Diagnosis Date  . Diabetes ([place]) 02/2010   treated with oral med's  . Plantar fasciitis    Right Heel    Past Surgical History:  Procedure Laterality Date  . TONSILLECTOMY AND ADENOIDECTOMY  1991    Current Outpatient Medications  Medication Sig Dispense Refill  . Cetirizine HCl (ZYRTEC PO) Take by mouth.    Marland Kitchen glucose blood (BAYER CONTOUR TEST) test strip 1 each by Other route daily. Use as instructed    . Ibuprofen (MOTRIN PO) Take by mouth as needed.     . Lancets  MISC by Does not apply route. Contour lancets; check blood sugar every morning    . lisinopril (PRINIVIL,ZESTRIL) 2.5 MG tablet Take 2.5 mg by mouth daily.    . metFORMIN (GLUCOPHAGE-XR) 500 MG 24 hr tablet 2 (two) times daily before a meal.     . Multiple Vitamins-Minerals (MULTIVITAMIN PO) Take by mouth.     No current facility-administered medications for this visit.     Family History  Problem Relation Age of Onset  . Diabetes Mother   . Cervical cancer Mother   . Kidney cancer Mother        hx. cervical cancer  . Diabetes Father        right leg below knee amputation  . Stroke Father   . Hypertension Father   . Hyperlipidemia Brother   . Hypertension Brother   . Diabetes Maternal Grandmother   . Diabetes Paternal Grandmother   . COPD Paternal Grandmother     ROS:  Pertinent items are noted in HPI.  Otherwise, a comprehensive ROS was negative.  Exam:   BP 104/68   Pulse 68   Temp 98.2 F (36.8 C) (Skin)   Resp 16   Ht 5' 4.5" (1.638 m)   Wt 153 lb (69.4 kg)   LMP 08/10/2018 (Exact Date)   BMI 25.86 kg/m  Height: 5' 4.5" (163.8 cm) Ht Readings from Last 3 Encounters:  08/16/18 5' 4.5" (1.638 m)  05/16/17  5' 4.5" (1.638 m)  05/10/16 5' 4.5" (1.638 m)    General appearance: alert, cooperative and appears stated age Head: Normocephalic, without obvious abnormality, atraumatic Neck: no adenopathy, supple, symmetrical, trachea midline and thyroid normal to inspection and palpation Lungs: clear to auscultation bilaterally Breasts: normal appearance, no masses or tenderness, No nipple retraction or dimpling, No nipple discharge or bleeding, No axillary or supraclavicular adenopathy Heart: regular rate and rhythm Abdomen: soft, non-tender; no masses,  no organomegaly Extremities: extremities normal, atraumatic, no cyanosis or edema Skin: Skin color, texture, turgor normal. No rashes or lesions Lymph nodes: Cervical, supraclavicular, and axillary nodes normal. No  abnormal inguinal nodes palpated Neurologic: Grossly normal   Pelvic: External genitalia:  no lesions              Urethra:  normal appearing urethra with no masses, tenderness or lesions              Bartholin's and Skene's: normal                 Vagina: normal appearing vagina with normal color and discharge, no lesions              Cervix: no cervical motion tenderness, no lesions and nulliparous appearance              Pap taken: Yes.   Bimanual Exam:  Uterus:  normal size, contour, position, consistency, mobility, non-tender and anteverted              Adnexa: normal adnexa and no mass, fullness, tenderness               Rectovaginal: Confirms               Anus:  normal sphincter tone, no lesions  Chaperone present: yes  A:  Well Woman with normal exam  Contraception none not sexually active  Glucose management/labs with PCP  Colonoscopy due    P:   Reviewed health and wellness pertinent to exam  Discussed importance of follow up with PCP as indicated.  Discussed risks/benefits of colonoscopy, patient plans to schedule through PCP  Pap smear: yes   counseled on breast self exam, mammography screening, feminine hygiene, menopause, adequate intake of calcium and vitamin D, diet and exercise  return annually or prn  An After Visit Summary was printed and given to the patient.

## 2018-08-15 ENCOUNTER — Other Ambulatory Visit: Payer: Self-pay

## 2018-08-16 ENCOUNTER — Encounter: Payer: Self-pay | Admitting: Certified Nurse Midwife

## 2018-08-16 ENCOUNTER — Other Ambulatory Visit (HOSPITAL_COMMUNITY)
Admission: RE | Admit: 2018-08-16 | Discharge: 2018-08-16 | Disposition: A | Discharge status: Home / Self Care | Payer: 59 | Source: Ambulatory Visit | Attending: Certified Nurse Midwife | Admitting: Certified Nurse Midwife

## 2018-08-16 ENCOUNTER — Ambulatory Visit (INDEPENDENT_AMBULATORY_CARE_PROVIDER_SITE_OTHER): Payer: 59 | Admitting: Certified Nurse Midwife

## 2018-08-16 ENCOUNTER — Other Ambulatory Visit: Payer: Self-pay

## 2018-08-16 VITALS — BP 104/68 | HR 68 | Temp 98.2°F | Resp 16 | Ht 64.5 in | Wt 153.0 lb

## 2018-08-16 DIAGNOSIS — Z124 Encounter for screening for malignant neoplasm of cervix: Secondary | ICD-10-CM | POA: Insufficient documentation

## 2018-08-16 DIAGNOSIS — N951 Menopausal and female climacteric states: Secondary | ICD-10-CM | POA: Diagnosis not present

## 2018-08-16 DIAGNOSIS — Z01419 Encounter for gynecological examination (general) (routine) without abnormal findings: Secondary | ICD-10-CM

## 2018-08-16 NOTE — Patient Instructions (Signed)

## 2018-08-20 LAB — CYTOLOGY - PAP
Diagnosis: NEGATIVE
HPV: NOT DETECTED

## 2019-04-16 ENCOUNTER — Other Ambulatory Visit: Payer: Self-pay | Admitting: Family Medicine

## 2019-04-16 DIAGNOSIS — Z1231 Encounter for screening mammogram for malignant neoplasm of breast: Secondary | ICD-10-CM

## 2019-05-12 ENCOUNTER — Encounter: Payer: Self-pay | Admitting: Certified Nurse Midwife

## 2019-05-19 ENCOUNTER — Ambulatory Visit: Payer: 59

## 2019-06-09 ENCOUNTER — Other Ambulatory Visit: Payer: Self-pay | Admitting: Family Medicine

## 2019-06-09 ENCOUNTER — Ambulatory Visit
Admission: RE | Admit: 2019-06-09 | Discharge: 2019-06-09 | Disposition: A | Discharge status: Home / Self Care | Payer: 59 | Source: Ambulatory Visit | Attending: Family Medicine | Admitting: Family Medicine

## 2019-06-09 DIAGNOSIS — S20212A Contusion of left front wall of thorax, initial encounter: Secondary | ICD-10-CM

## 2019-07-22 ENCOUNTER — Other Ambulatory Visit: Payer: Self-pay

## 2019-07-22 ENCOUNTER — Ambulatory Visit
Admission: RE | Admit: 2019-07-22 | Discharge: 2019-07-22 | Disposition: A | Discharge status: Home / Self Care | Payer: 59 | Source: Ambulatory Visit | Attending: Family Medicine | Admitting: Family Medicine

## 2019-07-22 DIAGNOSIS — Z1231 Encounter for screening mammogram for malignant neoplasm of breast: Secondary | ICD-10-CM

## 2019-08-20 ENCOUNTER — Ambulatory Visit: Payer: 59 | Admitting: Certified Nurse Midwife

## 2019-10-08 NOTE — Progress Notes (Signed)
49 y.o. G0P0 Single White or Caucasian Not Hispanic or Latino female here for annual exam.   Cycles are shorter and lighter. No significant vasomotor symptoms.  Period Cycle (Days): 28 Period Duration (Days): 1-3 Period Pattern: Regular Menstrual Flow: Light, Moderate Menstrual Control: Thin pad Menstrual Control Change Freq (Hours): 5 Dysmenorrhea: (!) Moderate Dysmenorrhea Symptoms: Cramping   She has AODM, on oral medication. Last HgbA1C was 6.6 in July.  Patient's last menstrual period was 09/15/2019.          Sexually active: No.  The current method of family planning is none.    Exercising: Yes.    Walking Dog  Smoker:  no  Health Maintenance: Pap:6/26/20WNL HR HPV Neg   04/28/15 WNL HR HPV Neg  History of abnormal Pap:  no MMG:  07/22/19 density C Bi-rads 1 neg  BMD:   Never  Colonoscopy: 2021 normal  TDaP:  October, 2012 Gardasil: N/A    reports that she has never smoked. She has never used smokeless tobacco. She reports that she does not drink alcohol and does not use drugs. She works for a [employer]. She travels a lot. Volunteers for [employer].   Past Medical History:  Diagnosis Date  . Diabetes ([facility]) 02/2010   treated with oral med's  . Plantar fasciitis    Right Heel    Past Surgical History:  Procedure Laterality Date  . TONSILLECTOMY AND ADENOIDECTOMY  1991    Current Outpatient Medications  Medication Sig Dispense Refill  . Cetirizine HCl (ZYRTEC PO) Take by mouth.    Marland Kitchen glucose blood (BAYER CONTOUR TEST) test strip 1 each by Other route daily. Use as instructed    . Ibuprofen (MOTRIN PO) Take by mouth as needed.     . Lancets MISC by Does not apply route. Contour lancets; check blood sugar every morning    . lisinopril (PRINIVIL,ZESTRIL) 2.5 MG tablet Take 2.5 mg by mouth daily.    . metFORMIN (GLUCOPHAGE) 500 MG tablet Take 1,000 mg by mouth 2 (two) times daily. Take 1 bid    . Multiple Vitamins-Minerals (MULTIVITAMIN PO) Take by mouth.    .  sertraline (ZOLOFT) 100 MG tablet Take 100 mg by mouth daily.    Marland Kitchen VITAMIN D PO Take by mouth.     No current facility-administered medications for this visit.    Family History  Problem Relation Age of Onset  . Diabetes Mother   . Cervical cancer Mother   . Kidney cancer Mother        hx. cervical cancer  . Diabetes Father        right leg below knee amputation  . Stroke Father   . Hypertension Father   . Hyperlipidemia Brother   . Hypertension Brother   . Diabetes Maternal Grandmother   . Diabetes Paternal Grandmother   . COPD Paternal Grandmother     Review of Systems  All other systems reviewed and are negative.   Exam:   BP 110/72   Pulse 81   Ht 5' 4.75" (1.645 m)   Wt 157 lb 12.8 oz (71.6 kg)   LMP 09/15/2019   SpO2 99%   BMI 26.46 kg/m   Weight change: @WEIGHTCHANGE @ Height:   Height: 5' 4.75" (164.5 cm)  Ht Readings from Last 3 Encounters:  10/09/19 5' 4.75" (1.645 m)  08/16/18 5' 4.5" (1.638 m)  05/16/17 5' 4.5" (1.638 m)    General appearance: alert, cooperative and appears stated age Head: Normocephalic, without  obvious abnormality, atraumatic Neck: no adenopathy, supple, symmetrical, trachea midline and thyroid normal to inspection and palpation Lungs: clear to auscultation bilaterally Cardiovascular: regular rate and rhythm Breasts: normal appearance, no masses or tenderness Abdomen: soft, non-tender; non distended,  no masses,  no organomegaly Extremities: extremities normal, atraumatic, no cyanosis or edema Skin: Skin color, texture, turgor normal. No rashes or lesions Lymph nodes: Cervical, supraclavicular, and axillary nodes normal. No abnormal inguinal nodes palpated Neurologic: Grossly normal   Pelvic: External genitalia:  no lesions              Urethra:  normal appearing urethra with no masses, tenderness or lesions              Bartholins and Skenes: normal                 Vagina: normal appearing vagina with normal color and  discharge, no lesions              Cervix: no lesions               Bimanual Exam:  Uterus:  normal size, contour, position, consistency, mobility, non-tender              Adnexa: no mass, fullness, tenderness               Rectovaginal: Confirms               Anus:  normal sphincter tone, no lesions  Marissa Foster chaperoned for the exam.  A:  Well Woman with normal exam  AODM managed by Marissa Foster  P:   No pap this year  Mammogram and colonoscopy are UTD  Discussed breast self exam  Discussed calcium and vit D intake  Labs with primary

## 2019-10-09 ENCOUNTER — Ambulatory Visit: Payer: 59 | Admitting: Obstetrics and Gynecology

## 2019-10-09 ENCOUNTER — Encounter: Payer: Self-pay | Admitting: Obstetrics and Gynecology

## 2019-10-09 ENCOUNTER — Other Ambulatory Visit: Payer: Self-pay

## 2019-10-09 VITALS — BP 110/72 | HR 81 | Ht 64.75 in | Wt 157.8 lb

## 2019-10-09 DIAGNOSIS — Z01419 Encounter for gynecological examination (general) (routine) without abnormal findings: Secondary | ICD-10-CM | POA: Diagnosis not present

## 2019-10-09 NOTE — Patient Instructions (Addendum)
Perimenopause  Perimenopause is the normal time of life before and after menstrual periods stop completely (menopause). Perimenopause can begin 2-8 years before menopause, and it usually lasts for 1 year after menopause. During perimenopause, the ovaries may or may not produce an egg. What are the causes? This condition is caused by a natural change in hormone levels that happens as you get older. What increases the risk? This condition is more likely to start at an earlier age if you have certain medical conditions or treatments, including:  A tumor of the pituitary gland in the brain.  A disease that affects the ovaries and hormone production.  Radiation treatment for cancer.  Certain cancer treatments, such as chemotherapy or hormone (anti-estrogen) therapy.  Heavy smoking and excessive alcohol use.  Family history of early menopause. What are the signs or symptoms? Perimenopausal changes affect each woman differently. Symptoms of this condition may include:  Hot flashes.  Night sweats.  Irregular menstrual periods.  Decreased sex drive.  Vaginal dryness.  Headaches.  Mood swings.  Depression.  Memory problems or trouble concentrating.  Irritability.  Tiredness.  Weight gain.  Anxiety.  Trouble getting pregnant. How is this diagnosed? This condition is diagnosed based on your medical history, a physical exam, your age, your menstrual history, and your symptoms. Hormone tests may also be done. How is this treated? In some cases, no treatment is needed. You and your health care provider should make a decision together about whether treatment is necessary. Treatment will be based on your individual condition and preferences. Various treatments are available, such as:  Menopausal hormone therapy (MHT).  Medicines to treat specific symptoms.  Acupuncture.  Vitamin or herbal supplements. Before starting treatment, make sure to let your health care provider  know if you have a personal or family history of:  Heart disease.  Breast cancer.  Blood clots.  Diabetes.  Osteoporosis. Follow these instructions at home: Lifestyle  Do not use any products that contain nicotine or tobacco, such as cigarettes and e-cigarettes. If you need help quitting, ask your health care provider.  Eat a balanced diet that includes fresh fruits and vegetables, whole grains, soybeans, eggs, lean meat, and low-fat dairy.  Get at least 30 minutes of physical activity on 5 or more days each week.  Avoid alcoholic and caffeinated beverages, as well as spicy foods. This may help prevent hot flashes.  Get 7-8 hours of sleep each night.  Dress in layers that can be removed to help you manage hot flashes.  Find ways to manage stress, such as deep breathing, meditation, or journaling. General instructions  Keep track of your menstrual periods, including: ? When they occur. ? How heavy they are and how long they last. ? How much time passes between periods.  Keep track of your symptoms, noting when they start, how often you have them, and how long they last.  Take over-the-counter and prescription medicines only as told by your health care provider.  Take vitamin supplements only as told by your health care provider. These may include calcium, vitamin E, and vitamin D.  Use vaginal lubricants or moisturizers to help with vaginal dryness and improve comfort during sex.  Talk with your health care provider before starting any herbal supplements.  Keep all follow-up visits as told by your health care provider. This is important. This includes any group therapy or counseling. Contact a health care provider if:  You have heavy vaginal bleeding or pass blood clots.  Your period   lasts more than 2 days longer than normal.  Your periods are recurring sooner than 21 days.  You bleed after having sex. Get help right away if:  You have chest pain, trouble  breathing, or trouble talking.  You have severe depression.  You have pain when you urinate.  You have severe headaches.  You have vision problems. Summary  Perimenopause is the time when a woman's body begins to move into menopause. This may happen naturally or as a result of other health problems or medical treatments.  Perimenopause can begin 2-8 years before menopause, and it usually lasts for 1 year after menopause.  Perimenopausal symptoms can be managed through medicines, lifestyle changes, and complementary therapies such as acupuncture. This information is not intended to replace advice given to you by your health care provider. Make sure you discuss any questions you have with your health care provider. Document Revised: 01/19/2017 Document Reviewed: 03/14/2016 Elsevier Patient Education  2020 [employer]   EXERCISE AND DIET:  We recommended that you start or continue a regular exercise program for good health. Regular exercise means any activity that makes your heart beat faster and makes you sweat.  We recommend exercising at least 30 minutes per day at least 3 days a week, preferably 4 or 5.  We also recommend a diet low in fat and sugar.  Inactivity, poor dietary choices and obesity can cause diabetes, heart attack, stroke, and kidney damage, among others.    ALCOHOL AND SMOKING:  Women should limit their alcohol intake to no more than 7 drinks/beers/glasses of wine (combined, not each!) per week. Moderation of alcohol intake to this level decreases your risk of breast cancer and liver damage. And of course, no recreational drugs are part of a healthy lifestyle.  And absolutely no smoking or even second hand smoke. Most people know smoking can cause heart and lung diseases, but did you know it also contributes to weakening of your bones? Aging of your skin?  Yellowing of your teeth and nails?  CALCIUM AND VITAMIN D:  Adequate intake of calcium and Vitamin D are recommended.   The recommendations for exact amounts of these supplements seem to change often, but generally speaking 1,000 mg of calcium (between diet and supplement) and 800 units of Vitamin D per day seems prudent. Certain women may benefit from higher intake of Vitamin D.  If you are among these women, your doctor will have told you during your visit.    PAP SMEARS:  Pap smears, to check for cervical cancer or precancers,  have traditionally been done yearly, although recent scientific advances have shown that most women can have pap smears less often.  However, every woman still should have a physical exam from her gynecologist every year. It will include a breast check, inspection of the vulva and vagina to check for abnormal growths or skin changes, a visual exam of the cervix, and then an exam to evaluate the size and shape of the uterus and ovaries.  And after 51 years of age, a rectal exam is indicated to check for rectal cancers. We will also provide age appropriate advice regarding health maintenance, like when you should have certain vaccines, screening for sexually transmitted diseases, bone density testing, colonoscopy, mammograms, etc.   MAMMOGRAMS:  All women over 40 years old should have a yearly mammogram. Many facilities now offer a "3D" mammogram, which may cost around $50 extra out of pocket. If possible,  we recommend you accept the option   to have the 3D mammogram performed.  It both reduces the number of women who will be called back for extra views which then turn out to be normal, and it is better than the routine mammogram at detecting truly abnormal areas.    COLON CANCER SCREENING: Now recommend starting at age 45. At this time colonoscopy is not covered for routine screening until 50. There are take home tests that can be done between 45-49.   COLONOSCOPY:  Colonoscopy to screen for colon cancer is recommended for all women at age 50.  We know, you hate the idea of the prep.  We agree, BUT,  having colon cancer and not knowing it is worse!!  Colon cancer so often starts as a polyp that can be seen and removed at colonscopy, which can quite literally save your life!  And if your first colonoscopy is normal and you have no family history of colon cancer, most women don't have to have it again for 10 years.  Once every ten years, you can do something that may end up saving your life, right?  We will be happy to help you get it scheduled when you are ready.  Be sure to check your insurance coverage so you understand how much it will cost.  It may be covered as a preventative service at no cost, but you should check your particular policy.      Breast Self-Awareness Breast self-awareness means being familiar with how your breasts look and feel. It involves checking your breasts regularly and reporting any changes to your health care provider. Practicing breast self-awareness is important. A change in your breasts can be a sign of a serious medical problem. Being familiar with how your breasts look and feel allows you to find any problems early, when treatment is more likely to be successful. All women should practice breast self-awareness, including women who have had breast implants. How to do a breast self-exam One way to learn what is normal for your breasts and whether your breasts are changing is to do a breast self-exam. To do a breast self-exam: Look for Changes  1. Remove all the clothing above your waist. 2. Stand in front of a mirror in a room with good lighting. 3. Put your hands on your hips. 4. Push your hands firmly downward. 5. Compare your breasts in the mirror. Look for differences between them (asymmetry), such as: ? Differences in shape. ? Differences in size. ? Puckers, dips, and bumps in one breast and not the other. 6. Look at each breast for changes in your skin, such as: ? Redness. ? Scaly areas. 7. Look for changes in your nipples, such  as: ? Discharge. ? Bleeding. ? Dimpling. ? Redness. ? A change in position. Feel for Changes Carefully feel your breasts for lumps and changes. It is best to do this while lying on your back on the floor and again while sitting or standing in the shower or tub with soapy water on your skin. Feel each breast in the following way:  Place the arm on the side of the breast you are examining above your head.  Feel your breast with the other hand.  Start in the nipple area and make  inch (2 cm) overlapping circles to feel your breast. Use the pads of your three middle fingers to do this. Apply light pressure, then medium pressure, then firm pressure. The light pressure will allow you to feel the tissue closest to the skin. The   medium pressure will allow you to feel the tissue that is a little deeper. The firm pressure will allow you to feel the tissue close to the ribs.  Continue the overlapping circles, moving downward over the breast until you feel your ribs below your breast.  Move one finger-width toward the center of the body. Continue to use the  inch (2 cm) overlapping circles to feel your breast as you move slowly up toward your collarbone.  Continue the up and down exam using all three pressures until you reach your armpit.  Write Down What You Find  Write down what is normal for each breast and any changes that you find. Keep a written record with breast changes or normal findings for each breast. By writing this information down, you do not need to depend only on memory for size, tenderness, or location. Write down where you are in your menstrual cycle, if you are still menstruating. If you are having trouble noticing differences in your breasts, do not get discouraged. With time you will become more familiar with the variations in your breasts and more comfortable with the exam. How often should I examine my breasts? Examine your breasts every month. If you are breastfeeding, the  best time to examine your breasts is after a feeding or after using a breast pump. If you menstruate, the best time to examine your breasts is 5-7 days after your period is over. During your period, your breasts are lumpier, and it may be more difficult to notice changes. When should I see my health care provider? See your health care provider if you notice:  A change in shape or size of your breasts or nipples.  A change in the skin of your breast or nipples, such as a reddened or scaly area.  Unusual discharge from your nipples.  A lump or thick area that was not there before.  Pain in your breasts.  Anything that concerns you.  

## 2019-12-14 IMAGING — MG DIGITAL SCREENING BILATERAL MAMMOGRAM WITH TOMO AND CAD
8 series · 9 of 24 positions shown · non-contrast
Comparison: Previous exam(s).

CLINICAL DATA: Screening.

EXAM:
DIGITAL SCREENING BILATERAL MAMMOGRAM WITH TOMO AND CAD

[L MLO synth-2D]
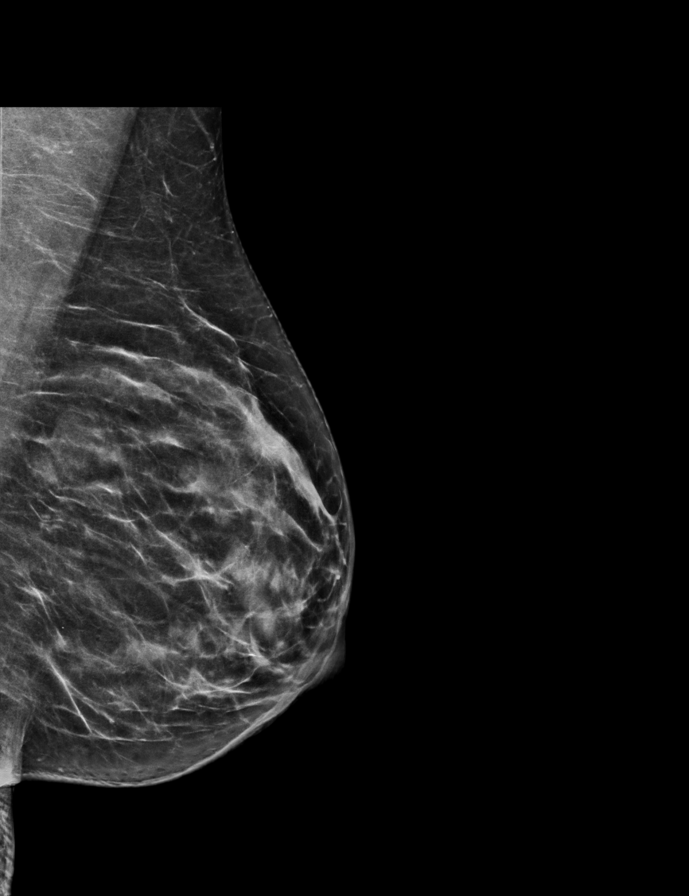

[R MLO synth-2D]
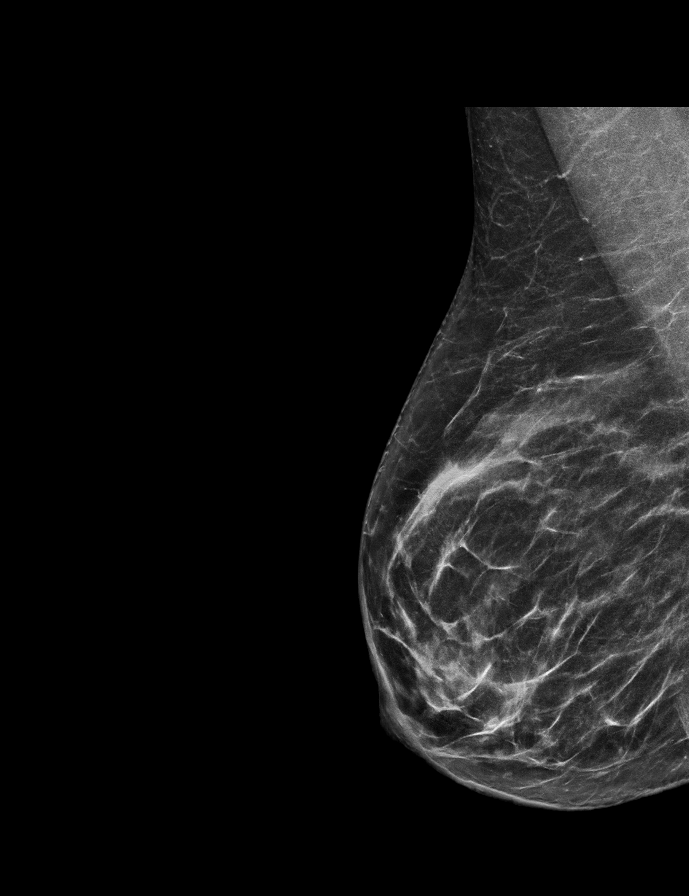

[R CC synth-2D]
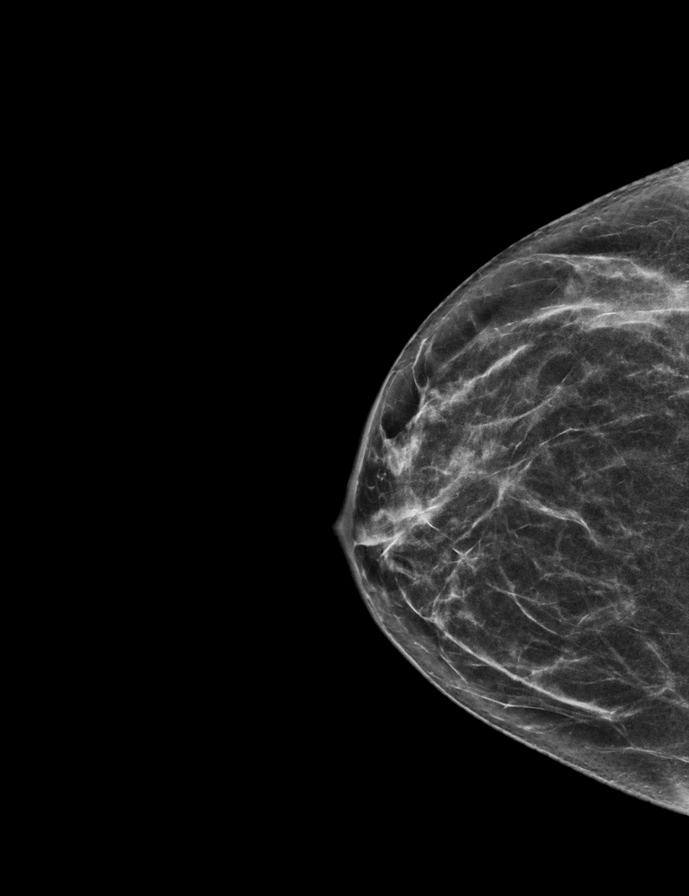

[L CC synth-2D]
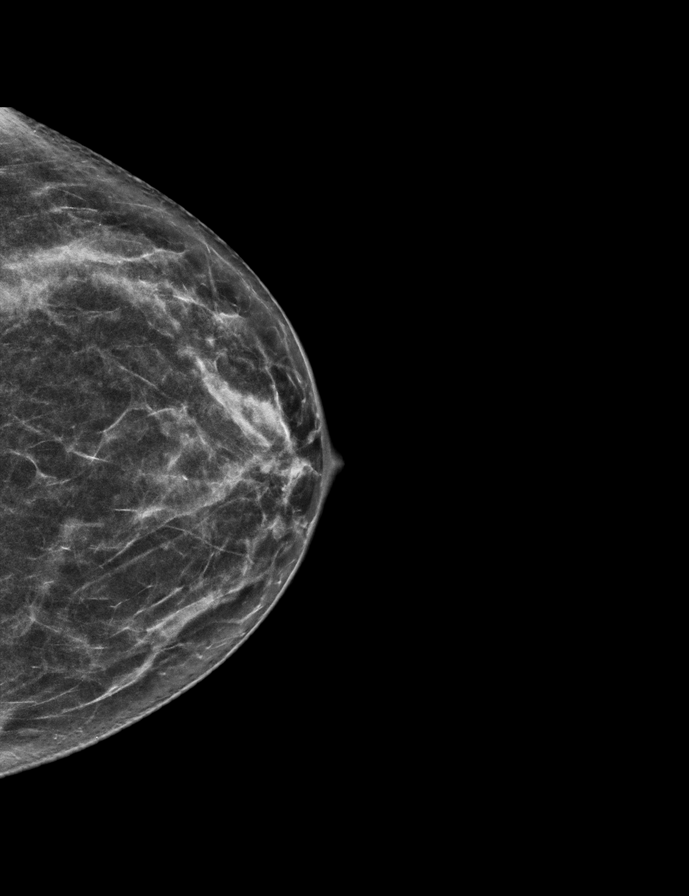

[R CC tomo · 2 of 56 frames shown]
[frame 19/56]
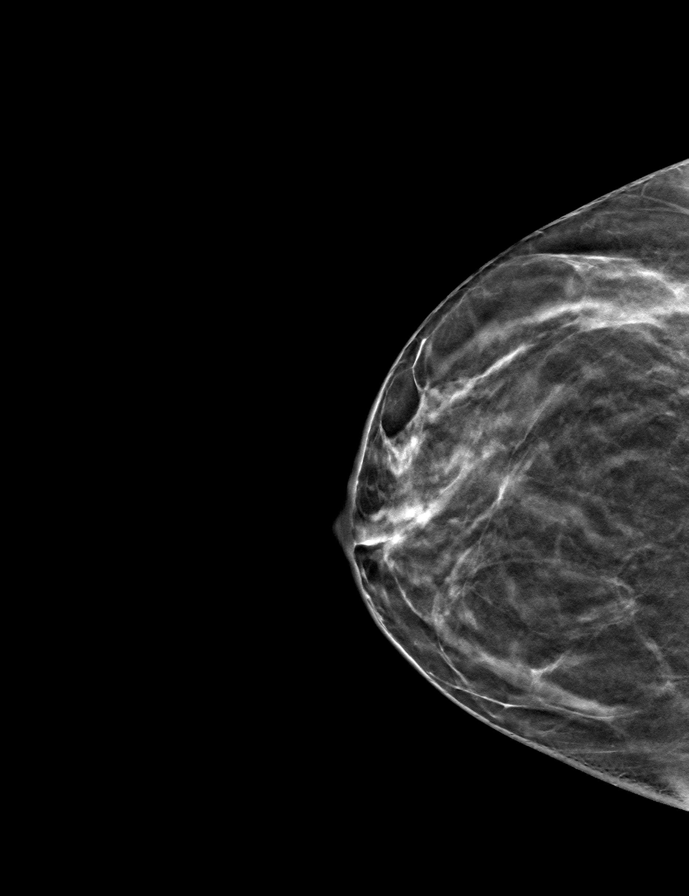
[frame 29/56]
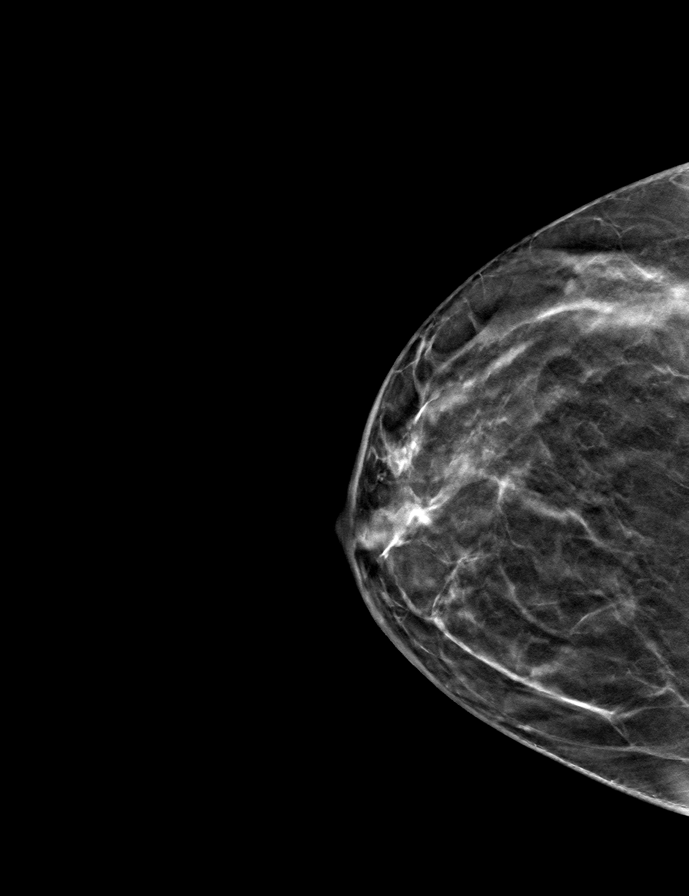

[R MLO tomo · tomo slice 32/63.0]
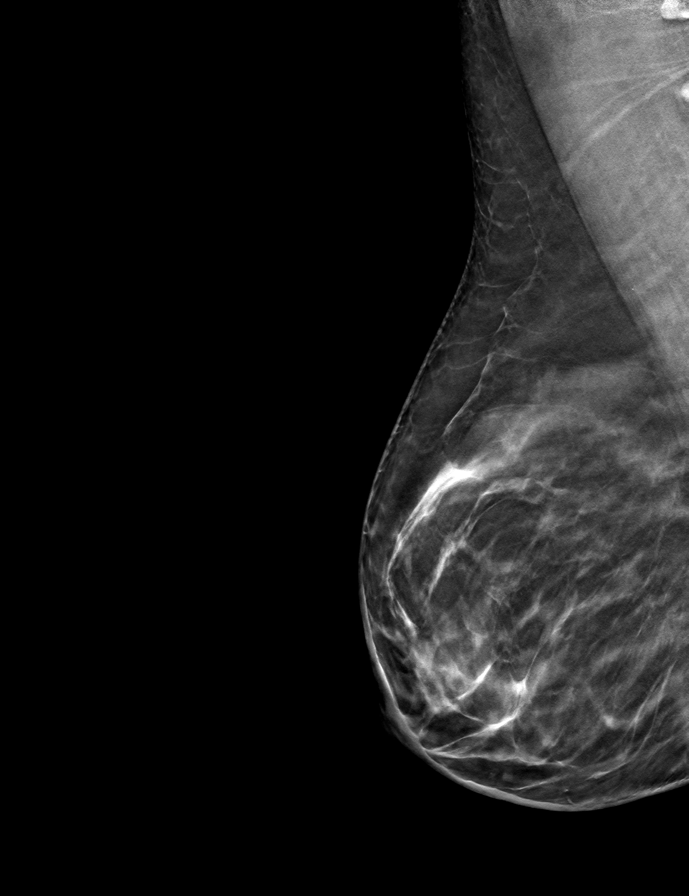

[L CC tomo · tomo slice 29/56.0]
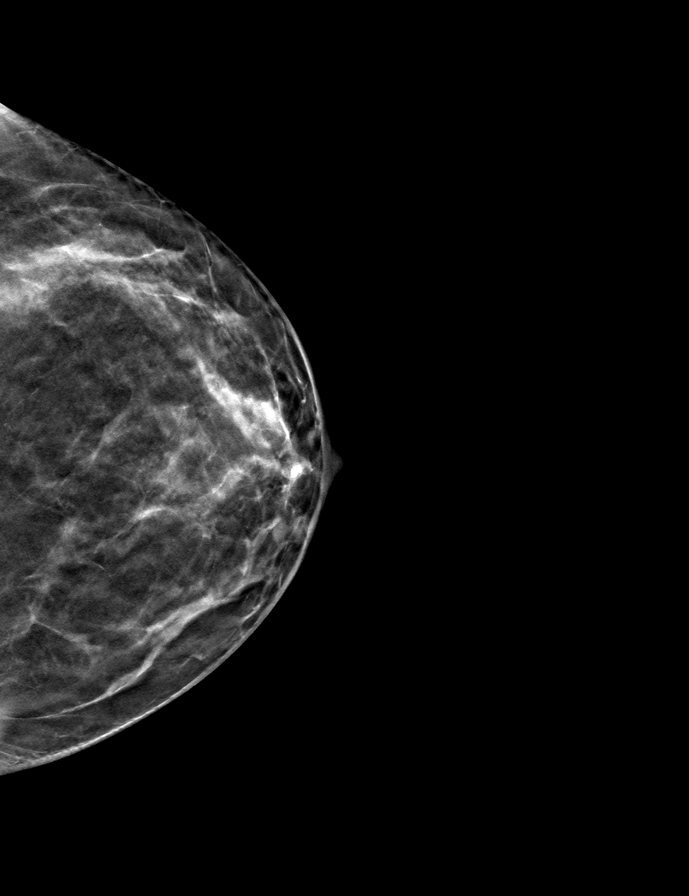

[L MLO tomo · tomo slice 33/64.0]
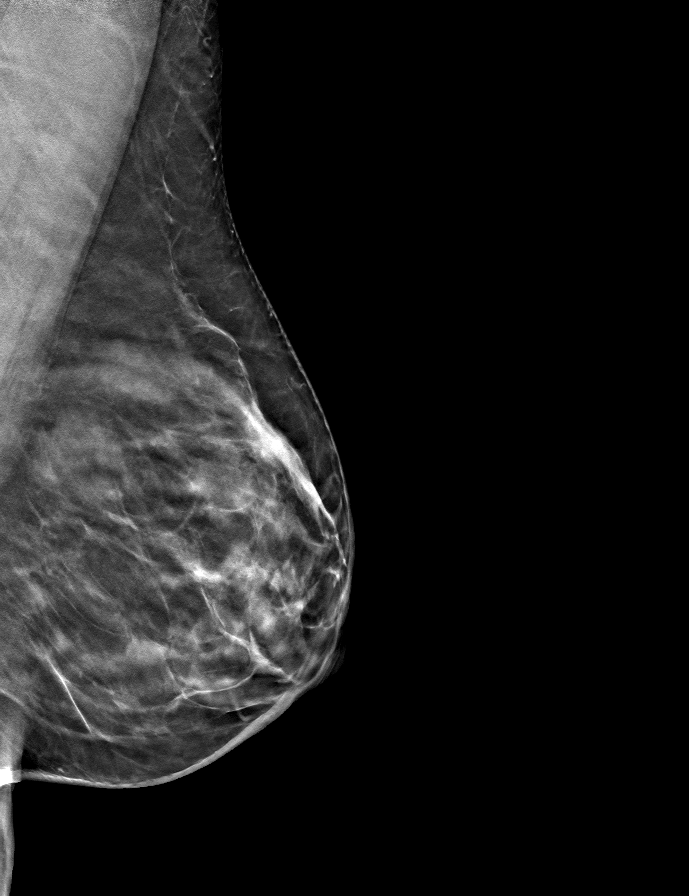

[9 of 24 positions shown; findings below may reference images not displayed]

ACR Breast Density Category b: There are scattered areas of
fibroglandular density.
FINDINGS: There are no findings suspicious for malignancy. Images were
processed with CAD.
IMPRESSION: No mammographic evidence of malignancy. A result letter of this
screening mammogram will be mailed directly to the patient.

RECOMMENDATION:
Screening mammogram in one year. (Code:[id number])

BI-RADS CATEGORY  1: Negative.

## 2020-07-08 ENCOUNTER — Other Ambulatory Visit: Payer: Self-pay | Admitting: Family Medicine

## 2020-07-08 DIAGNOSIS — Z1231 Encounter for screening mammogram for malignant neoplasm of breast: Secondary | ICD-10-CM

## 2020-08-05 ENCOUNTER — Ambulatory Visit
Admission: RE | Admit: 2020-08-05 | Discharge: 2020-08-05 | Disposition: A | Discharge status: Home / Self Care | Payer: 59 | Source: Ambulatory Visit | Attending: Family Medicine | Admitting: Family Medicine

## 2020-08-05 ENCOUNTER — Other Ambulatory Visit: Payer: Self-pay

## 2020-08-05 DIAGNOSIS — Z1231 Encounter for screening mammogram for malignant neoplasm of breast: Secondary | ICD-10-CM

## 2020-10-19 NOTE — Progress Notes (Signed)
50 y.o. G0P0 Single White or Caucasian Not Hispanic or Latino female here for annual exam.   Period Cycle (Days): 28 Period Duration (Days): 3-5 Period Pattern: Regular Menstrual Flow: Moderate Menstrual Control: Thin pad Menstrual Control Change Freq (Hours): 4 Dysmenorrhea: (!) Moderate Dysmenorrhea Symptoms: Cramping, Headache, Diarrhea She is having tolerable hot flashes, no night sweats, no sleep disturbances.   No bowel or bladder  Patient's last menstrual period was 09/26/2020.          Sexually active: No.  The current method of family planning is abstinence.    Exercising: Yes.     Walking 2 miles daily  with her dogs.  Smoker:  no  Health Maintenance: Pap:  6/26/20WNL HR HPV Neg   04/28/15 WNL HR HPV Neg  History of abnormal Pap:  no MMG:  08/06/20 density Bi-rads 1 neg  BMD:   none  Colonoscopy: 2/21 normal, f/u in 10 years.  TDaP:  12/14/10 Gardasil: na   reports that she has never smoked. She has never used smokeless tobacco. She reports that she does not drink alcohol and does not use drugs. She works for a [employer]. She travels a lot. Volunteers for [employer]. She has 3 Doberman's.  Past Medical History:  Diagnosis Date   Diabetes (HCC) 02/2010   treated with oral med's   Plantar fasciitis    Right Heel    Past Surgical History:  Procedure Laterality Date   TONSILLECTOMY AND ADENOIDECTOMY  1991    Current Outpatient Medications  Medication Sig Dispense Refill   buPROPion (WELLBUTRIN XL) 150 MG 24 hr tablet Take 150 mg by mouth every morning.     Cetirizine HCl (ZYRTEC PO) Take by mouth.     glucose blood test strip 1 each by Other route daily. Use as instructed     Ibuprofen (MOTRIN PO) Take by mouth as needed.      JARDIANCE 25 MG TABS tablet Take 25 mg by mouth daily.     Lancets MISC by Does not apply route. Contour lancets; check blood sugar every morning     lisinopril (PRINIVIL,ZESTRIL) 2.5 MG tablet Take 2.5 mg by mouth daily.      metFORMIN (GLUCOPHAGE) 500 MG tablet Take 1,000 mg by mouth 2 (two) times daily. Take 1 bid     Multiple Vitamins-Minerals (MULTIVITAMIN PO) Take by mouth.     sertraline (ZOLOFT) 100 MG tablet Take 100 mg by mouth daily.     VITAMIN D PO Take by mouth.     No current facility-administered medications for this visit.    Family History  Problem Relation Age of Onset   Diabetes Mother    Cervical cancer Mother    Kidney cancer Mother        hx. cervical cancer   Diabetes Father        right leg below knee amputation   Stroke Father    Hypertension Father    Hyperlipidemia Brother    Hypertension Brother    Diabetes Maternal Grandmother    Diabetes Paternal Grandmother    COPD Paternal Grandmother     Review of Systems  All other systems reviewed and are negative.  Exam:   BP 122/64   Pulse 85   Ht 5' 4.5" (1.638 m)   Wt 153 lb (69.4 kg)   LMP 09/26/2020   SpO2 98%   BMI 25.86 kg/m   Weight change: @WEIGHTCHANGE @ Height:   Height: 5' 4.5" (163.8 cm)  Ht Readings  from Last 3 Encounters:  10/20/20 5' 4.5" (1.638 m)  10/09/19 5' 4.75" (1.645 m)  08/16/18 5' 4.5" (1.638 m)    General appearance: alert, cooperative and appears stated age Head: Normocephalic, without obvious abnormality, atraumatic Neck: no adenopathy, supple, symmetrical, trachea midline and thyroid normal to inspection and palpation Lungs: clear to auscultation bilaterally Cardiovascular: regular rate and rhythm Breasts: normal appearance, no masses or tenderness Abdomen: soft, non-tender; non distended,  no masses,  no organomegaly Extremities: extremities normal, atraumatic, no cyanosis or edema Skin: Skin color, texture, turgor normal. No rashes or lesions Lymph nodes: Cervical, supraclavicular, and axillary nodes normal. No abnormal inguinal nodes palpated Neurologic: Grossly normal   Pelvic: External genitalia:  no lesions              Urethra:  normal appearing urethra with no masses,  tenderness or lesions              Bartholins and Skenes: normal                 Vagina: normal appearing vagina with normal color and discharge, no lesions              Cervix: no lesions               Bimanual Exam:  Uterus:  normal size, contour, position, consistency, mobility, non-tender              Adnexa: no mass, fullness, tenderness               Rectovaginal: Confirms               Anus:  normal sphincter tone, no lesions  Carolynn Serve chaperoned for the exam.  1. Well woman exam Discussed breast self exam Discussed calcium and vit D intake No pap this year Mammogram UTD Colonoscopy UTD Labs with primary TDAP with primary

## 2020-10-20 ENCOUNTER — Ambulatory Visit (INDEPENDENT_AMBULATORY_CARE_PROVIDER_SITE_OTHER): Payer: 59 | Admitting: Obstetrics and Gynecology

## 2020-10-20 ENCOUNTER — Other Ambulatory Visit: Payer: Self-pay

## 2020-10-20 ENCOUNTER — Encounter: Payer: Self-pay | Admitting: Obstetrics and Gynecology

## 2020-10-20 VITALS — BP 122/64 | HR 85 | Ht 64.5 in | Wt 153.0 lb

## 2020-10-20 DIAGNOSIS — Z01419 Encounter for gynecological examination (general) (routine) without abnormal findings: Secondary | ICD-10-CM | POA: Diagnosis not present

## 2020-10-20 NOTE — Patient Instructions (Signed)

## 2021-09-07 ENCOUNTER — Other Ambulatory Visit: Payer: Self-pay | Admitting: Family Medicine

## 2021-09-07 DIAGNOSIS — Z1231 Encounter for screening mammogram for malignant neoplasm of breast: Secondary | ICD-10-CM

## 2021-09-23 ENCOUNTER — Ambulatory Visit
Admission: RE | Admit: 2021-09-23 | Discharge: 2021-09-23 | Disposition: A | Discharge status: Home / Self Care | Payer: 59 | Source: Ambulatory Visit | Attending: Family Medicine | Admitting: Family Medicine

## 2021-09-23 DIAGNOSIS — Z1231 Encounter for screening mammogram for malignant neoplasm of breast: Secondary | ICD-10-CM

## 2021-10-26 ENCOUNTER — Ambulatory Visit: Payer: 59 | Admitting: Obstetrics and Gynecology

## 2022-06-23 DIAGNOSIS — F411 Generalized anxiety disorder: Secondary | ICD-10-CM | POA: Diagnosis not present

## 2022-07-13 DIAGNOSIS — F411 Generalized anxiety disorder: Secondary | ICD-10-CM | POA: Diagnosis not present

## 2022-07-21 DIAGNOSIS — Z23 Encounter for immunization: Secondary | ICD-10-CM | POA: Diagnosis not present

## 2022-07-31 DIAGNOSIS — F411 Generalized anxiety disorder: Secondary | ICD-10-CM | POA: Diagnosis not present

## 2022-07-31 DIAGNOSIS — K219 Gastro-esophageal reflux disease without esophagitis: Secondary | ICD-10-CM | POA: Diagnosis not present

## 2022-07-31 DIAGNOSIS — E119 Type 2 diabetes mellitus without complications: Secondary | ICD-10-CM | POA: Diagnosis not present

## 2022-08-23 DIAGNOSIS — F411 Generalized anxiety disorder: Secondary | ICD-10-CM | POA: Diagnosis not present

## 2022-09-06 DIAGNOSIS — F411 Generalized anxiety disorder: Secondary | ICD-10-CM | POA: Diagnosis not present

## 2022-09-18 DIAGNOSIS — L259 Unspecified contact dermatitis, unspecified cause: Secondary | ICD-10-CM | POA: Diagnosis not present

## 2022-09-20 DIAGNOSIS — F411 Generalized anxiety disorder: Secondary | ICD-10-CM | POA: Diagnosis not present

## 2022-09-29 DIAGNOSIS — F411 Generalized anxiety disorder: Secondary | ICD-10-CM | POA: Diagnosis not present

## 2022-09-29 DIAGNOSIS — F331 Major depressive disorder, recurrent, moderate: Secondary | ICD-10-CM | POA: Diagnosis not present

## 2022-10-12 ENCOUNTER — Other Ambulatory Visit: Payer: Self-pay | Admitting: Family Medicine

## 2022-10-12 DIAGNOSIS — Z1231 Encounter for screening mammogram for malignant neoplasm of breast: Secondary | ICD-10-CM

## 2022-10-13 DIAGNOSIS — F411 Generalized anxiety disorder: Secondary | ICD-10-CM | POA: Diagnosis not present

## 2022-10-25 DIAGNOSIS — F331 Major depressive disorder, recurrent, moderate: Secondary | ICD-10-CM | POA: Diagnosis not present

## 2022-10-25 DIAGNOSIS — F411 Generalized anxiety disorder: Secondary | ICD-10-CM | POA: Diagnosis not present

## 2022-10-28 DIAGNOSIS — F411 Generalized anxiety disorder: Secondary | ICD-10-CM | POA: Diagnosis not present

## 2022-11-02 ENCOUNTER — Ambulatory Visit
Admission: RE | Admit: 2022-11-02 | Discharge: 2022-11-02 | Disposition: A | Discharge status: Home / Self Care | Payer: BC Managed Care – PPO | Source: Ambulatory Visit | Attending: Family Medicine | Admitting: Family Medicine

## 2022-11-02 DIAGNOSIS — Z1231 Encounter for screening mammogram for malignant neoplasm of breast: Secondary | ICD-10-CM | POA: Diagnosis not present

## 2022-11-03 DIAGNOSIS — E119 Type 2 diabetes mellitus without complications: Secondary | ICD-10-CM | POA: Diagnosis not present

## 2022-11-11 DIAGNOSIS — F411 Generalized anxiety disorder: Secondary | ICD-10-CM | POA: Diagnosis not present

## 2022-11-14 DIAGNOSIS — Z1331 Encounter for screening for depression: Secondary | ICD-10-CM | POA: Diagnosis not present

## 2022-11-14 DIAGNOSIS — Z01419 Encounter for gynecological examination (general) (routine) without abnormal findings: Secondary | ICD-10-CM | POA: Diagnosis not present

## 2022-11-21 DIAGNOSIS — F331 Major depressive disorder, recurrent, moderate: Secondary | ICD-10-CM | POA: Diagnosis not present

## 2022-11-21 DIAGNOSIS — F411 Generalized anxiety disorder: Secondary | ICD-10-CM | POA: Diagnosis not present

## 2022-11-25 DIAGNOSIS — F411 Generalized anxiety disorder: Secondary | ICD-10-CM | POA: Diagnosis not present

## 2022-12-04 DIAGNOSIS — F411 Generalized anxiety disorder: Secondary | ICD-10-CM | POA: Diagnosis not present

## 2022-12-20 DIAGNOSIS — F411 Generalized anxiety disorder: Secondary | ICD-10-CM | POA: Diagnosis not present

## 2022-12-27 DIAGNOSIS — E119 Type 2 diabetes mellitus without complications: Secondary | ICD-10-CM | POA: Diagnosis not present

## 2022-12-27 DIAGNOSIS — H52203 Unspecified astigmatism, bilateral: Secondary | ICD-10-CM | POA: Diagnosis not present

## 2023-01-05 DIAGNOSIS — F411 Generalized anxiety disorder: Secondary | ICD-10-CM | POA: Diagnosis not present

## 2023-01-15 ENCOUNTER — Other Ambulatory Visit (HOSPITAL_BASED_OUTPATIENT_CLINIC_OR_DEPARTMENT_OTHER): Payer: Self-pay

## 2023-01-15 MED ORDER — INFLUENZA VIRUS VACC SPLIT PF (FLUZONE) 0.5 ML IM SUSY
0.5000 mL | PREFILLED_SYRINGE | Freq: Once | INTRAMUSCULAR | 0 refills | Status: AC
  Filled 2023-01-15: qty 0.5, 1d supply, fill #0

## 2023-01-26 DIAGNOSIS — F411 Generalized anxiety disorder: Secondary | ICD-10-CM | POA: Diagnosis not present

## 2023-01-30 DIAGNOSIS — F411 Generalized anxiety disorder: Secondary | ICD-10-CM | POA: Diagnosis not present

## 2023-02-06 DIAGNOSIS — F411 Generalized anxiety disorder: Secondary | ICD-10-CM | POA: Diagnosis not present

## 2023-02-06 DIAGNOSIS — F331 Major depressive disorder, recurrent, moderate: Secondary | ICD-10-CM | POA: Diagnosis not present

## 2023-02-09 DIAGNOSIS — F411 Generalized anxiety disorder: Secondary | ICD-10-CM | POA: Diagnosis not present

## 2023-02-23 DIAGNOSIS — F411 Generalized anxiety disorder: Secondary | ICD-10-CM | POA: Diagnosis not present

## 2023-02-27 DIAGNOSIS — Z0000 Encounter for general adult medical examination without abnormal findings: Secondary | ICD-10-CM | POA: Diagnosis not present

## 2023-02-27 DIAGNOSIS — Z23 Encounter for immunization: Secondary | ICD-10-CM | POA: Diagnosis not present

## 2023-02-27 DIAGNOSIS — F411 Generalized anxiety disorder: Secondary | ICD-10-CM | POA: Diagnosis not present

## 2023-02-27 DIAGNOSIS — E119 Type 2 diabetes mellitus without complications: Secondary | ICD-10-CM | POA: Diagnosis not present

## 2023-03-06 DIAGNOSIS — F331 Major depressive disorder, recurrent, moderate: Secondary | ICD-10-CM | POA: Diagnosis not present

## 2023-03-06 DIAGNOSIS — F411 Generalized anxiety disorder: Secondary | ICD-10-CM | POA: Diagnosis not present

## 2023-03-08 DIAGNOSIS — F411 Generalized anxiety disorder: Secondary | ICD-10-CM | POA: Diagnosis not present

## 2023-03-23 DIAGNOSIS — F411 Generalized anxiety disorder: Secondary | ICD-10-CM | POA: Diagnosis not present

## 2023-04-06 DIAGNOSIS — F411 Generalized anxiety disorder: Secondary | ICD-10-CM | POA: Diagnosis not present

## 2023-04-23 DIAGNOSIS — F411 Generalized anxiety disorder: Secondary | ICD-10-CM | POA: Diagnosis not present

## 2023-05-19 DIAGNOSIS — F411 Generalized anxiety disorder: Secondary | ICD-10-CM | POA: Diagnosis not present

## 2023-06-01 DIAGNOSIS — F411 Generalized anxiety disorder: Secondary | ICD-10-CM | POA: Diagnosis not present

## 2023-06-05 DIAGNOSIS — F331 Major depressive disorder, recurrent, moderate: Secondary | ICD-10-CM | POA: Diagnosis not present

## 2023-06-05 DIAGNOSIS — F411 Generalized anxiety disorder: Secondary | ICD-10-CM | POA: Diagnosis not present

## 2023-11-07 ENCOUNTER — Other Ambulatory Visit: Payer: Self-pay | Admitting: Family Medicine

## 2023-11-07 DIAGNOSIS — Z1231 Encounter for screening mammogram for malignant neoplasm of breast: Secondary | ICD-10-CM

## 2023-11-09 ENCOUNTER — Ambulatory Visit
Admission: RE | Admit: 2023-11-09 | Discharge: 2023-11-09 | Disposition: A | Discharge status: Home / Self Care | Source: Ambulatory Visit | Attending: Family Medicine | Admitting: Family Medicine

## 2023-11-09 DIAGNOSIS — Z1231 Encounter for screening mammogram for malignant neoplasm of breast: Secondary | ICD-10-CM

## 2024-01-18 ENCOUNTER — Other Ambulatory Visit (HOSPITAL_BASED_OUTPATIENT_CLINIC_OR_DEPARTMENT_OTHER): Payer: Self-pay

## 2024-01-18 MED ORDER — FLUZONE 0.5 ML IM SUSY
0.5000 mL | PREFILLED_SYRINGE | Freq: Once | INTRAMUSCULAR | 0 refills | Status: AC
  Filled 2024-01-18: qty 0.5, 1d supply, fill #0
